# Patient Record
Sex: Female | Born: 1960 | Race: White | Hispanic: No | Marital: Married | Smoking: Never smoker
Health system: Southern US, Community
[De-identification: ages and names within clinical notes are randomized; demographics above are authoritative.]

## PROBLEM LIST (undated history)

## (undated) DIAGNOSIS — C4491 Basal cell carcinoma of skin, unspecified: Secondary | ICD-10-CM

## (undated) DIAGNOSIS — N810 Urethrocele: Secondary | ICD-10-CM

## (undated) DIAGNOSIS — Z8619 Personal history of other infectious and parasitic diseases: Secondary | ICD-10-CM

## (undated) HISTORY — DX: Basal cell carcinoma of skin, unspecified: C44.91

## (undated) HISTORY — DX: Personal history of other infectious and parasitic diseases: Z86.19

## (undated) HISTORY — PX: TUBAL LIGATION: SHX77

## (undated) HISTORY — PX: KNEE SURGERY: SHX244

## (undated) HISTORY — PX: MOHS SURGERY: SHX181

## (undated) HISTORY — DX: Urethrocele: N81.0

---
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
## 1998-05-10 ENCOUNTER — Other Ambulatory Visit: Admission: RE | Admit: 1998-05-10 | Discharge: 1998-05-10 | Payer: Self-pay | Admitting: Obstetrics and Gynecology

## 1999-06-19 ENCOUNTER — Other Ambulatory Visit: Admission: RE | Admit: 1999-06-19 | Discharge: 1999-06-19 | Payer: Self-pay | Admitting: Obstetrics and Gynecology

## 2000-07-02 ENCOUNTER — Encounter: Payer: Self-pay | Admitting: Obstetrics and Gynecology

## 2000-07-02 ENCOUNTER — Ambulatory Visit (HOSPITAL_COMMUNITY): Admission: RE | Admit: 2000-07-02 | Discharge: 2000-07-02 | Payer: Self-pay | Admitting: Obstetrics and Gynecology

## 2000-07-13 ENCOUNTER — Other Ambulatory Visit: Admission: RE | Admit: 2000-07-13 | Discharge: 2000-07-13 | Payer: Self-pay | Admitting: Obstetrics and Gynecology

## 2001-06-16 ENCOUNTER — Other Ambulatory Visit: Admission: RE | Admit: 2001-06-16 | Discharge: 2001-06-16 | Payer: Self-pay | Admitting: Obstetrics and Gynecology

## 2002-04-12 ENCOUNTER — Emergency Department (HOSPITAL_COMMUNITY): Admission: EM | Admit: 2002-04-12 | Discharge: 2002-04-12 | Payer: Self-pay | Admitting: Emergency Medicine

## 2002-04-13 ENCOUNTER — Ambulatory Visit (HOSPITAL_COMMUNITY): Admission: RE | Admit: 2002-04-13 | Discharge: 2002-04-13 | Payer: Self-pay | Admitting: Vascular Surgery

## 2002-06-02 ENCOUNTER — Ambulatory Visit (HOSPITAL_COMMUNITY): Admission: RE | Admit: 2002-06-02 | Discharge: 2002-06-02 | Payer: Self-pay | Admitting: Obstetrics and Gynecology

## 2002-07-05 ENCOUNTER — Other Ambulatory Visit: Admission: RE | Admit: 2002-07-05 | Discharge: 2002-07-05 | Payer: Self-pay | Admitting: Obstetrics and Gynecology

## 2002-07-11 ENCOUNTER — Encounter: Payer: Self-pay | Admitting: Obstetrics and Gynecology

## 2002-07-11 ENCOUNTER — Ambulatory Visit (HOSPITAL_COMMUNITY): Admission: RE | Admit: 2002-07-11 | Discharge: 2002-07-11 | Payer: Self-pay | Admitting: Obstetrics and Gynecology

## 2003-02-05 ENCOUNTER — Encounter: Payer: Self-pay | Admitting: Emergency Medicine

## 2003-02-05 ENCOUNTER — Emergency Department (HOSPITAL_COMMUNITY): Admission: EM | Admit: 2003-02-05 | Discharge: 2003-02-05 | Payer: Self-pay | Admitting: Unknown Physician Specialty

## 2004-05-06 ENCOUNTER — Other Ambulatory Visit: Admission: RE | Admit: 2004-05-06 | Discharge: 2004-05-06 | Payer: Self-pay | Admitting: Obstetrics and Gynecology

## 2004-10-28 ENCOUNTER — Ambulatory Visit (HOSPITAL_COMMUNITY): Admission: RE | Admit: 2004-10-28 | Discharge: 2004-10-28 | Payer: Self-pay | Admitting: Obstetrics and Gynecology

## 2005-05-15 ENCOUNTER — Other Ambulatory Visit: Admission: RE | Admit: 2005-05-15 | Discharge: 2005-05-15 | Payer: Self-pay | Admitting: Obstetrics and Gynecology

## 2005-07-07 ENCOUNTER — Emergency Department (HOSPITAL_COMMUNITY): Admission: EM | Admit: 2005-07-07 | Discharge: 2005-07-07 | Payer: Self-pay | Admitting: Emergency Medicine

## 2005-07-08 ENCOUNTER — Ambulatory Visit: Payer: Self-pay

## 2005-07-08 ENCOUNTER — Ambulatory Visit: Payer: Self-pay | Admitting: Cardiology

## 2005-07-11 ENCOUNTER — Ambulatory Visit: Payer: Self-pay | Admitting: Cardiology

## 2005-11-21 ENCOUNTER — Ambulatory Visit (HOSPITAL_COMMUNITY): Admission: RE | Admit: 2005-11-21 | Discharge: 2005-11-21 | Payer: Self-pay | Admitting: Obstetrics and Gynecology

## 2006-05-28 ENCOUNTER — Other Ambulatory Visit: Admission: RE | Admit: 2006-05-28 | Discharge: 2006-05-28 | Payer: Self-pay | Admitting: Obstetrics and Gynecology

## 2007-01-12 ENCOUNTER — Ambulatory Visit (HOSPITAL_COMMUNITY): Admission: RE | Admit: 2007-01-12 | Discharge: 2007-01-12 | Payer: Self-pay | Admitting: Obstetrics and Gynecology

## 2007-07-14 ENCOUNTER — Other Ambulatory Visit: Admission: RE | Admit: 2007-07-14 | Discharge: 2007-07-14 | Payer: Self-pay | Admitting: Obstetrics and Gynecology

## 2008-01-18 ENCOUNTER — Ambulatory Visit (HOSPITAL_COMMUNITY): Admission: RE | Admit: 2008-01-18 | Discharge: 2008-01-18 | Payer: Self-pay | Admitting: Obstetrics and Gynecology

## 2008-07-17 ENCOUNTER — Other Ambulatory Visit: Admission: RE | Admit: 2008-07-17 | Discharge: 2008-07-17 | Payer: Self-pay | Admitting: Obstetrics and Gynecology

## 2009-01-30 ENCOUNTER — Ambulatory Visit (HOSPITAL_COMMUNITY): Admission: RE | Admit: 2009-01-30 | Discharge: 2009-01-30 | Payer: Self-pay | Admitting: Obstetrics and Gynecology

## 2009-07-18 ENCOUNTER — Other Ambulatory Visit: Admission: RE | Admit: 2009-07-18 | Discharge: 2009-07-18 | Payer: Self-pay | Admitting: Obstetrics and Gynecology

## 2010-02-07 ENCOUNTER — Ambulatory Visit (HOSPITAL_COMMUNITY): Admission: RE | Admit: 2010-02-07 | Discharge: 2010-02-07 | Payer: Self-pay | Admitting: Obstetrics and Gynecology

## 2011-01-07 ENCOUNTER — Other Ambulatory Visit (HOSPITAL_COMMUNITY): Payer: Self-pay | Admitting: Obstetrics and Gynecology

## 2011-01-07 DIAGNOSIS — Z1231 Encounter for screening mammogram for malignant neoplasm of breast: Secondary | ICD-10-CM

## 2011-02-07 NOTE — Op Note (Signed)
NAME:  Annette Marks, Annette Marks                          ACCOUNT NO.:  [id number]   MEDICAL RECORD NO.:  [id number]                   PATIENT TYPE:  AMB   LOCATION:  SDC                                  FACILITY:  [facility]   PHYSICIAN:  Diana B. Thomasena Edis, M.D.              DATE OF BIRTH:  [birth year]   DATE OF PROCEDURE:  06/02/2002  DATE OF DISCHARGE:                                 OPERATIVE REPORT   PREOPERATIVE DIAGNOSES:  Multiparity, undesired fertility.   POSTOPERATIVE DIAGNOSES:  Multiparity, undesired fertility.   PROCEDURE:  Open laparoscopic tubal ligation using Falope rings.   SURGEON:  Beather Arbour. Thomasena Edis, M.D.   ESTIMATED BLOOD LOSS:  Minimal.   DRAINS:  None.   ANESTHESIA:  General endotracheal.   FLUIDS:  Approximately 1500 cc of crystalloid.   COMPLICATIONS:  None.   FINDINGS:  Normal pelvis.  Slightly retroverted uterus.  Normal appendix,  uterus, tubes, and ovaries.  Left tube was slightly tortuous and edematous.  Right tube was normal.   DESCRIPTION OF OPERATION:  The patient is brought to the operating room,  identified on the operating room table.  After induction of adequate general  endotracheal anesthesia the patient was placed in [place] stirrups and prepped  and draped in the usual fashion.  Examination under anesthesia revealed the  uterus to be slightly retroverted approximately 6 weeks and mobile.  The  speculum was placed and the posterior lip of the cervix was grasped with a  single tooth tenaculum and the Jarcho cannula was placed for uterine  mobility.  Attention was turned to the abdomen.  A small infraumbilical  incision was made and carried down to the fascia.  The fascia was scored in  the midline and extended bilaterally using Mayo scissors.  The peritoneum  was then identified and entered bluntly taking care to avoid bowel or other  abdominal contents.  This was done with the patient in Trendelenburg.  The  Hasson cannula was then placed and two  sutures of 0 Vicryl had been placed  at either angle of the fascial incision and were used to secure the Hasson  cannula in place.  The CO2 gas was attached and approximately 4 L of CO2 gas  was infused without difficulty.  The laparoscope was then placed in mid  avascular portion.  The abdominal wall was identified.  This was noted to be  well above the bladder.  A small incision was made in the midline and the  tissue was dissected using a hemostat down to the fascia.  The 8 mm trocar  was then placed under direct laparoscopic guidance.  This was done very  carefully and gently to decrease the risk of damage to adjacent structures.  The trocar was removed and the Falope ring applicator was placed.  The left  tube was identified and traced from its proximal to its distal end with  fimbria  being identified.  A paratubal cyst was noted.  It was picked up in  a mid avascular portion taking care to drop the mesosalpinx to increase the  risk of tubal failure.  A Falope ring was placed without difficulty.  It was  noted to blanch white immediately and, in fact, an excellent portion of tube  was noted to be contained within the ring.  As mentioned above, it was  placed around the tube taking care to drops the mesosalpinx.  In a similar  fashion the right tube was identified, traced from its proximal to its  distal end, the fimbria being identified.  A mid avascular portion was  identified, grasped with the Falope ring applicator, and the ring was placed  without difficulty.  It was noted to be well placed with mesosalpinx  contained within the tube.  The appendix was identified and then  photographed.  At that point examination revealed there to be a normal  pelvis.  The ovaries appeared to be normal.  There is no endometriosis which  could be visualized.  The Falope ring applicator was removed and the 8 mm  sleeve was removed.  There was noted to be no bleeding from the __________  punch trocar  site.  Subsequently, the Hasson cannula and laparoscope were  removed.  The fascia was closed using one of the sutures of 0 Vicryl.  This  was done in a simple running fashion.  Excellent fascial closure was  assured.  There was noted to be no bleeding on the subcutaneous tissue.  Marcaine 0.25% approximately 8 cc was placed in the cut edges of the  incisions for patient comfort.  The skin was closed using subcuticular  stitches of 4-0 undyed Vicryl.  Attention was then turned to the Jarcho  cannula.  The Jarcho cannula and tenaculum were removed.  There was noted to  be a slight amount of bleeding at tenaculum site and excellent hemostasis  was achieved using silver nitrate sticks.  The bladder was again straight  catheterized.  Initially had been straight catheterized for approximately  200 cc of urine.  Straight catheterized postoperative about 100 cc of urine.  The patient tolerated the procedure well without apparent complications and  was transferred to the recovery room in stable condition after all  instrument, sponge, needle counts were correct.  She was given laparoscopic  discharge instructions and urged to return to the office in two to three  weeks for the postoperative visit.  She was given a prescription for  Darvocet-N 100 dispensed 30 to take one to two p.o. q.4-6h. p.r.n. pain and  call with any problems.                                               Beather Arbour Thomasena Edis, M.D.    DBC/MEDQ  D:  06/02/2002  T:  06/02/2002  Job:  [id number]

## 2011-02-07 NOTE — H&P (Signed)
NAME:  Annette Marks, Annette Marks                          ACCOUNT NO.:  [id number]   MEDICAL RECORD NO.:  [id number]                   PATIENT TYPE:  AMB   LOCATION:  SDC                                  [facility]   PHYSICIAN:  Diana B. Thomasena Marks, M.D.              DATE OF BIRTH:  1961/05/16   DATE OF ADMISSION:  06/02/2002  DATE OF DISCHARGE:                                HISTORY & PHYSICAL   CURRENT HISTORY:  The patient is a 49 year old gravida 2, para 2, Caucasian  female who desires permanent sterilization.  She had originally decided on  this earlier and then subsequently changed her mind and started oral  contraceptives.  This was because she was having irregular cycles.  However,  she subsequently was experiencing some shortness of breath and dizziness and  was concerned that she might have a DVT, subsequently was seen in the  emergency room and had a Doppler study which was negative.  However, she has  decided to discontinue her oral contraceptives and still desires a tubal  ligation.  Risks of surgery including anesthetic complications, hemorrhage,  infection, damage to adjacent structures including bladder, bowel, blood  vessels, ureter were discussed with the patient.  She was made of the  failure of 10-15:1000 as well as if her periods become irregular she may  need to be back on oral contraceptives.  She furthermore has decided that  even if she were to remarry desires no further children.  She is further  made aware that if she were to have any failure from the tubal ligation this  is likely to be an ectopic pregnancy due to tubal damage, the etiology of  ectopic pregnancy as it relates to fistula formation was discussed with the  patient in detail.  She expresses understanding of and acceptance of these  risks and desires to proceed with this tubal ligation.  She does understand  that she must keep track of her periods and if they become irregular she  needs to do a  pregnancy test to rule out pregnancy.   OBSTETRICAL/GYNECOLOGICAL HISTORY:  Menarche age 89, cycles are every 25  days with a 4-day duration of flow.   PAST MEDICAL HISTORY:  History of pruritus ani which subsequently was  treated and resolved.   CURRENT MEDICATIONS:  None.   SURGERIES:  ACL repairs 1979 and 1997.   FAMILY HISTORY:  There is no family history of colon, breast, ovarian, or  prostate cancer.  The patient's mother is 55, alive and well; father is 21,  alive and well; she has two sisters ages 78 and 65 alive and well; one  daughter age 58 now, alive and well, two children ages 80 and 80, alive and  well.   SOCIAL HISTORY:  Occupation: The patient is an Charity fundraiser at [employer].  Tobacco: None.  Alcohol: None.   REVIEW OF SYSTEMS:  Noncontributory  except as noted above.  Denies headache,  visual changes, chest pain, shortness of breath, abdominal pain, change in  bowel habits, unintentional weight loss, dysuria, urgency, frequency,  vaginal coitus or discharge, pain or bleeding with intercourse.   PHYSICAL EXAMINATION:  GENERAL: Well-developed Caucasian female.  VITAL SIGNS: Blood pressure 118/70 heart rate 72, weight 162.  HEENT: Normal.  NECK: Supple without thyromegaly or adenopathy.  LUNGS: Clear to auscultation.  CARDIAC: Regular rate and rhythm.  BREASTS: Symmetrical without masses; no dimpling, retraction, or nipple  discharge.  ABDOMEN: Soft, nontender; no hepatosplenomegaly or masses.  PELVIC: Normal external female genitalia.  No vulva, vaginal, or cervical  lesions.  Pap smear was performed on June 16, 2001 and was found to be  within normal limits.  Bimanual examination reveals the uterus to be 6  weeks, slightly retroverted, nontender without any adnexal mass palpated.  RECTAL: Excellent sphincter tone confirms pelvic exam; no masses palpated.   ASSESSMENT AND PLAN:  The patient is a 50 year old gravida 2, para 2,  Caucasian female who  desires permanent sterilization as she desires no  further children.  She is made aware of the permanency of the procedure,  failure rate of 10-15 in 1000, increased risk of ectopic pregnancy should  she become pregnant as well as the usual surgical risks as was stated above.  She is also made aware of unforeseen risks.  She adamantly says that she  desires no further children and does desire permanent sterilization.                                               Annette Marks, M.D.    DBC/MEDQ  D:  06/02/2002  T:  06/02/2002  Job:  [id number]   cc:   Preop Area at [facility]

## 2011-02-11 ENCOUNTER — Ambulatory Visit (HOSPITAL_COMMUNITY)
Admission: RE | Admit: 2011-02-11 | Discharge: 2011-02-11 | Disposition: A | Discharge status: Home / Self Care | Payer: BC Managed Care – PPO | Source: Ambulatory Visit | Attending: Obstetrics and Gynecology | Admitting: Obstetrics and Gynecology

## 2011-02-11 DIAGNOSIS — Z1231 Encounter for screening mammogram for malignant neoplasm of breast: Secondary | ICD-10-CM | POA: Insufficient documentation

## 2011-12-22 ENCOUNTER — Encounter: Payer: Self-pay | Admitting: Family Medicine

## 2011-12-22 ENCOUNTER — Other Ambulatory Visit: Payer: Self-pay | Admitting: Family Medicine

## 2011-12-22 ENCOUNTER — Ambulatory Visit (INDEPENDENT_AMBULATORY_CARE_PROVIDER_SITE_OTHER): Payer: BC Managed Care – PPO | Admitting: Family Medicine

## 2011-12-22 VITALS — BP 120/84 | HR 60 | Ht 67.0 in | Wt 159.0 lb

## 2011-12-22 DIAGNOSIS — E7800 Pure hypercholesterolemia, unspecified: Secondary | ICD-10-CM

## 2011-12-22 DIAGNOSIS — Z23 Encounter for immunization: Secondary | ICD-10-CM

## 2011-12-22 DIAGNOSIS — E559 Vitamin D deficiency, unspecified: Secondary | ICD-10-CM

## 2011-12-22 DIAGNOSIS — Z0000 Encounter for general adult medical examination without abnormal findings: Secondary | ICD-10-CM

## 2011-12-22 DIAGNOSIS — Z111 Encounter for screening for respiratory tuberculosis: Secondary | ICD-10-CM

## 2011-12-22 DIAGNOSIS — Z0184 Encounter for antibody response examination: Secondary | ICD-10-CM

## 2011-12-22 LAB — POCT URINALYSIS DIPSTICK
Blood, UA: NEGATIVE
Glucose, UA: NEGATIVE
Nitrite, UA: NEGATIVE
Urobilinogen, UA: NEGATIVE
pH, UA: 7

## 2011-12-22 NOTE — Patient Instructions (Addendum)
HEALTH MAINTENANCE RECOMMENDATIONS:  It is recommended that you get at least 30 minutes of aerobic exercise at least 5 days/week (for weight loss, you may need as much as 60-90 minutes). This can be any activity that gets your heart rate up. This can be divided in 10-15 minute intervals if needed, but try and build up your endurance at least once a week.  Weight bearing exercise is also recommended twice weekly.  Eat a healthy diet with lots of vegetables, fruits and fiber.  "Colorful" foods have a lot of vitamins (ie green vegetables, tomatoes, red peppers, etc).  Limit sweet tea, regular sodas and alcoholic beverages, all of which has a lot of calories and sugar.  Up to 1 alcoholic drink daily may be beneficial for women (unless trying to lose weight, watch sugars).  Drink a lot of water.  Calcium recommendations are 1200-1500 mg daily (1500 mg for postmenopausal women or women without ovaries), and vitamin D 1000 IU daily.  This should be obtained from diet and/or supplements (vitamins), and calcium should not be taken all at once, but in divided doses.  Monthly self breast exams and yearly mammograms for women over the age of 12 is recommended.  Sunscreen of at least SPF 30 should be used on all sun-exposed parts of the skin when outside between the hours of 10 am and 4 pm (not just when at beach or pool, but even with exercise, golf, tennis, and yard work!)  Use a sunscreen that says "broad spectrum" so it covers both UVA and UVB rays, and make sure to reapply every 1-2 hours.  Remember to change the batteries in your smoke detectors when changing your clock times in the spring and fall.  Use your seat belt every time you are in a car, and please drive safely and not be distracted with cell phones and texting while driving.   Vitamin D 1000-2000 IU daily to help maintain your levels.

## 2011-12-22 NOTE — Progress Notes (Signed)
Annette Marks is a 51 y.o. female who presents for a complete physical.  She has the following concerns:  She will be going back to school to get her Master of Nursing Informatics at [employer] in August.  She is selling her house, moving into an apartment (here in [place]) and will continue to work at the [employer].  Health Maintenance: Immunization History  Administered Date(s) Administered  . Hepatitis B 02/20/1985, 04/22/1985, 04/20/1992  . Influenza Whole 06/12/2011  . Td 01/01/2004   Last Pap smear: last year with Dr. Estanislado Pandy, 01/2011 Last mammogram: 01/2011 Last colonoscopy: 04/2011, Dr. Ewing Schlein, polyps (?hyperplastic)--told to f/u in 10 years Last DEXA: normal through school--heel screen (5 years ago) Dentist: twice yearly Ophtho: 1.5 years ago, wears glasses Exercise: hour weight lifting 3x/week, walks 3-6 miles 4x/week, and kickboxing Normal CBC, c-met and lipids 07/2010 (chol 222, HDL 88, LDL 121, TG 65)  Past Medical History  Diagnosis Date  . Urethrocele, female     h/o normal cystoscopy (Dr. Aldean Ast)  . Vitamin d deficiency 2010    23 by Dr. Thomasena Edis, normal after taking supplements    Past Surgical History  Procedure Date  . Knee surgery 1979, 1997    left--torn ligaments  . Tubal ligation     History   Social History  . Marital Status: Divorced    Spouse Name: N/A    Number of Children: 2  . Years of Education: N/A   Occupational History  . RN [employer]   Social History Main Topics  . Smoking status: Never Smoker   . Smokeless tobacco: Never Used  . Alcohol Use: Yes     2-3 drinks per month.  . Drug Use: No  . Sexually Active: Not on file   Other Topics Concern  . Not on file   Social History Narrative   Lives alone. Daughter is IT trainer in [place], son in [place]. No pets    Family History  Problem Relation Age of Onset  . Hyperlipidemia Father    No current outpatient prescriptions on file.  No Known  Allergies  ROS: The patient denies anorexia, fever, weight changes, headaches,  vision changes, decreased hearing, ear pain, sore throat, breast concerns, chest pain, palpitations, dizziness, syncope, dyspnea on exertion, cough, swelling, nausea, vomiting, diarrhea, constipation, abdominal pain, melena, hematochezia, indigestion/heartburn, hematuria, incontinence, dysuria, vaginal bleeding (slowing down--see below), discharge, odor or itch, genital lesions, joint pains, numbness, tingling, weakness, tremor, suspicious skin lesions, depression, anxiety, abnormal bleeding/bruising, or enlarged lymph nodes.  LMP 08/2011, before that was 12/2010.  +hot flashes, night sweats--sometimes had a strange sensation just prior to sweating.  Sees Dr. Karlyn Agee yearly.  PHYSICAL EXAM: BP 120/84  Pulse 60  Ht 5\' 7"  (1.702 m)  Wt 159 lb (72.122 kg)  BMI 24.90 kg/m2  LMP 01/17/2011  General Appearance:    Alert, cooperative, no distress, appears stated age  Head:    Normocephalic, without obvious abnormality, atraumatic  Eyes:    PERRL, conjunctiva/corneas clear, EOM's intact, fundi    benign  Ears:    Normal TM's and external ear canals  Nose:   Nares normal, mucosa normal, no drainage or sinus   tenderness  Throat:   Lips, mucosa, and tongue normal; teeth and gums normal  Neck:   Supple, no lymphadenopathy;  thyroid:  no   enlargement/tenderness/nodules; no carotid   bruit or JVD  Back:    Spine nontender, no curvature, ROM normal, no CVA  tenderness  Lungs:     Clear to auscultation bilaterally without wheezes, rales or     ronchi; respirations unlabored  Chest Wall:    No tenderness or deformity   Heart:    Regular rate and rhythm, S1 and S2 normal, no murmur, rub   or gallop  Breast Exam:    Deferred to GYN  Abdomen:     Soft, non-tender, nondistended, normoactive bowel sounds,    no masses, no hepatosplenomegaly  Genitalia:    Deferred to GYN     Extremities:   No clubbing, cyanosis or edema    Pulses:   2+ and symmetric all extremities  Skin:   Skin color, texture, turgor normal, no rashes or lesions  Lymph nodes:   Cervical, supraclavicular, and axillary nodes normal  Neurologic:   CNII-XII intact, normal strength, sensation and gait; reflexes 2+ and symmetric throughout          Psych:   Normal mood, affect, hygiene and grooming.    ASSESSMENT/PLAN: 1. Routine general medical examination at a health care facility  Visual acuity screening, POCT Urinalysis Dipstick, Glucose, random  2. Immunity status testing  Varicella zoster antibody, IgG, Measles/Mumps/Rubella Immunity  3. Need for Tdap vaccination  Tdap vaccine greater than or equal to 7yo IM  4. Pure hypercholesterolemia  Lipid panel  5. Unspecified vitamin D deficiency    6. Screening examination for pulmonary tuberculosis  TB Skin Test   Excellent lipid profile (with good HDL, and LDL<130), desires repeat today.  H/o Vitamin D deficiency--restart supplementation of 1000-2000 IU daily.  Discussed monthly self breast exams and yearly mammograms after the age of 63; at least 30 minutes of aerobic activity at least 5 days/week; proper sunscreen use reviewed; healthy diet, including goals of calcium and vitamin D intake and alcohol recommendations (less than or equal to 1 drink/day) reviewed; regular seatbelt use; changing batteries in smoke detectors.  Immunization recommendations discussed--TdaP today.  Colonoscopy recommendations reviewed--UTD

## 2011-12-23 LAB — LIPID PANEL
Cholesterol: 218 mg/dL — ABNORMAL HIGH (ref 0–200)
LDL Cholesterol: 116 mg/dL — ABNORMAL HIGH (ref 0–99)
VLDL: 14 mg/dL (ref 0–40)

## 2011-12-24 LAB — TB SKIN TEST: TB Skin Test: NEGATIVE mm

## 2012-01-07 ENCOUNTER — Other Ambulatory Visit (INDEPENDENT_AMBULATORY_CARE_PROVIDER_SITE_OTHER): Payer: BC Managed Care – PPO

## 2012-01-07 DIAGNOSIS — Z111 Encounter for screening for respiratory tuberculosis: Secondary | ICD-10-CM

## 2012-01-12 LAB — TB SKIN TEST
Induration: 0
TB Skin Test: NEGATIVE mm

## 2012-01-14 ENCOUNTER — Other Ambulatory Visit: Payer: Self-pay | Admitting: Obstetrics and Gynecology

## 2012-01-14 DIAGNOSIS — Z1231 Encounter for screening mammogram for malignant neoplasm of breast: Secondary | ICD-10-CM

## 2012-01-21 ENCOUNTER — Ambulatory Visit: Payer: Self-pay | Admitting: Obstetrics and Gynecology

## 2012-03-08 ENCOUNTER — Ambulatory Visit (HOSPITAL_COMMUNITY)
Admission: RE | Admit: 2012-03-08 | Discharge: 2012-03-08 | Disposition: A | Discharge status: Home / Self Care | Payer: BC Managed Care – PPO | Source: Ambulatory Visit | Attending: Obstetrics and Gynecology | Admitting: Obstetrics and Gynecology

## 2012-03-08 DIAGNOSIS — Z1231 Encounter for screening mammogram for malignant neoplasm of breast: Secondary | ICD-10-CM

## 2012-03-29 ENCOUNTER — Ambulatory Visit (INDEPENDENT_AMBULATORY_CARE_PROVIDER_SITE_OTHER): Payer: BC Managed Care – PPO | Admitting: Obstetrics and Gynecology

## 2012-03-29 ENCOUNTER — Encounter: Payer: Self-pay | Admitting: Obstetrics and Gynecology

## 2012-03-29 VITALS — BP 100/76 | Ht 68.75 in | Wt 166.0 lb

## 2012-03-29 DIAGNOSIS — Z124 Encounter for screening for malignant neoplasm of cervix: Secondary | ICD-10-CM

## 2012-03-29 DIAGNOSIS — Z780 Asymptomatic menopausal state: Secondary | ICD-10-CM

## 2012-03-29 DIAGNOSIS — N810 Urethrocele: Secondary | ICD-10-CM | POA: Insufficient documentation

## 2012-03-29 DIAGNOSIS — N368 Other specified disorders of urethra: Secondary | ICD-10-CM

## 2012-03-29 DIAGNOSIS — Z0000 Encounter for general adult medical examination without abnormal findings: Secondary | ICD-10-CM

## 2012-03-29 NOTE — Patient Instructions (Signed)
ACOG Menopause, HRT, Herbal supplements

## 2012-03-29 NOTE — Progress Notes (Signed)
The patient is not taking hormone replacement therapy Menopause 08/2011 The patient  is not taking a Calcium supplement. Post-menopausal bleeding:no  Last Pap: was normal April  2012 Last mammogram: was normal June 2013 Last DEXA scan : 10 yrs ago  normal Last colonoscopy:normal June 2012  Urinary symptoms: none Normal bowel movements: Yes Reports abuse at home: No  Subjective:    Annette Marks is a 51 y.o. female G2P2 who presents for annual exam.  The patient has no complaints today.   The following portions of the patient's history were reviewed and updated as appropriate: allergies, current medications, past family history, past medical history, past social history, past surgical history and problem list.  Review of Systems Pertinent items are noted in HPI. Gastrointestinal:No change in bowel habits, no abdominal pain, no rectal bleeding Genitourinary:negative for dysuria, frequency, hematuria, nocturia and urinary incontinence    Objective:     BP 100/76  Ht 5' 8.75" (1.746 m)  Wt 166 lb (75.297 kg)  BMI 24.69 kg/m2  LMP 09/06/2011  Weight:  Wt Readings from Last 1 Encounters:  03/29/12 166 lb (75.297 kg)     BMI: Body mass index is 24.69 kg/(m^2). General Appearance: Alert, appropriate appearance for age. No acute distress HEENT: Grossly normal Neck / Thyroid: Supple, no masses, nodes or enlargement Lungs: clear to auscultation bilaterally Back: No CVA tenderness Breast Exam: No masses or nodes.No dimpling, nipple retraction or discharge. Cardiovascular: Regular rate and rhythm. S1, S2, no murmur Gastrointestinal: Soft, non-tender, no masses or organomegaly Pelvic Exam: Vulva and vagina appear normal. Bimanual exam reveals normal uterus and adnexa. Rectovaginal: normal rectal, no masses Lymphatic Exam: Non-palpable nodes in neck, clavicular, axillary, or inguinal regions Skin: no rash or abnormalities Neurologic: Normal gait and speech, no tremor  Psychiatric:  Alert and oriented, appropriate affect.         Assessment:    Normal gyn exam Menopause    Plan:    Pap, Mammogram  Reviewed HRT, R&B, [facility]  Pt declines for now Follow-up:  for annual exam

## 2012-03-30 LAB — PAP IG W/ RFLX HPV ASCU

## 2012-07-15 ENCOUNTER — Encounter: Payer: Self-pay | Admitting: Family Medicine

## 2012-07-15 ENCOUNTER — Ambulatory Visit (INDEPENDENT_AMBULATORY_CARE_PROVIDER_SITE_OTHER): Payer: BC Managed Care – PPO | Admitting: Family Medicine

## 2012-07-15 VITALS — BP 102/68 | HR 60 | Temp 98.0°F | Ht 68.25 in | Wt 168.0 lb

## 2012-07-15 DIAGNOSIS — N390 Urinary tract infection, site not specified: Secondary | ICD-10-CM

## 2012-07-15 DIAGNOSIS — R350 Frequency of micturition: Secondary | ICD-10-CM

## 2012-07-15 LAB — POCT URINALYSIS DIPSTICK
Protein, UA: NEGATIVE
Urobilinogen, UA: NEGATIVE

## 2012-07-15 MED ORDER — CIPROFLOXACIN HCL 250 MG PO TABS: 250.0000 mg | ORAL_TABLET | Freq: Two times a day (BID) | ORAL | Status: DC

## 2012-07-15 NOTE — Patient Instructions (Addendum)
Call if your symptoms persist or worsen, especially if you develop high fevers, vomiting, flank pain.

## 2012-07-15 NOTE — Progress Notes (Signed)
Chief Complaint  Patient presents with  . Urinary Frequency    and pain started on Sunday. Felt better Monday. Tues at work did not drink many fluids but is still feeling a spastic feeling-just doesn't feel very good.   HPI: 4 days ago began with some urgency and dysuria.  She pushed fluids, and felt better the next day.  2 days ago, while in class in [place], she had recurrent pressure and some bladder spasms, as well as aching all over.  She was very busy yesterday, didn't drink much fluids, and burning and urgency got worse yesterday.  T 100.8 yesterday, and noticed blood in her urine and with wiping.  Denies any vaginal bleeding. Denies vaginal discharge.    Had some chills last night.  Hasn't taken any tylenol since last night.  H/o kidney infection during pregnancy 23 years ago. Denies flank pain.  Has some lower abdominal pressure/pain.  Denies nausea/vomiting.  Past Medical History  Diagnosis Date  . Urethrocele, female     h/o normal cystoscopy (Dr. Aldean Ast)  . Vitamin D deficiency 2010    23 by Dr. Thomasena Edis, normal after taking supplements  . H/O varicella   . H/O mumps    Past Surgical History  Procedure Date  . Knee surgery 1979, 1997    left--torn ligaments/acl  . Tubal ligation   . Wisdom tooth extraction 1979   History   Social History  . Marital Status: Divorced    Spouse Name: N/A    Number of Children: 2  . Years of Education: N/A   Occupational History  . RN [employer]   Social History Main Topics  . Smoking status: Never Smoker   . Smokeless tobacco: Never Used  . Alcohol Use: Yes     2-3 drinks per month.  . Drug Use: No  . Sexually Active: Yes    Birth Control/ Protection: Surgical     BTL   Other Topics Concern  . Not on file   Social History Narrative   Lives alone. Daughter is IT trainer in [place], son in [place]. No pets. 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 No Known Allergies  ROS:  Denies dizziness, nausea, vomiting, diarrhea, skin rash. +fever and urinary symptoms as per HPI.  Denies back pain or other concerns.  PHYSICAL EXAM: BP 102/68  Pulse 60  Temp 98 F (36.7 C)  Ht 5' 8.25" (1.734 m)  Wt 168 lb (76.204 kg)  BMI 25.36 kg/m2  LMP 09/06/2011 Well developed, pleasant female in no distress Back: no CVA tenderness Abdomen: soft, mild suprapubic tenderness.  No rebound tenderness, guarding or masses Skin: no rash Neuro: alert and oriented Psych: normal mood, affect  ASSESSMENT/PLAN:  1. Urinary frequency  POCT Urinalysis Dipstick  2. Urinary tract infection, site not specified  ciprofloxacin (CIPRO) 250 MG tablet, Urine culture   Call Monday if symptoms not resolved, sooner if ongoing fevers, develops flank pain, vomiting, can't tolerate PO ABX, etc.

## 2012-07-17 LAB — URINE CULTURE

## 2012-12-06 ENCOUNTER — Other Ambulatory Visit: Payer: BC Managed Care – PPO

## 2013-02-09 ENCOUNTER — Other Ambulatory Visit: Payer: Self-pay | Admitting: Family Medicine

## 2013-02-09 DIAGNOSIS — Z1231 Encounter for screening mammogram for malignant neoplasm of breast: Secondary | ICD-10-CM

## 2013-04-11 ENCOUNTER — Ambulatory Visit (HOSPITAL_COMMUNITY)
Admission: RE | Admit: 2013-04-11 | Discharge: 2013-04-11 | Disposition: A | Discharge status: Home / Self Care | Payer: 59 | Source: Ambulatory Visit | Attending: Family Medicine | Admitting: Family Medicine

## 2013-04-11 DIAGNOSIS — Z1231 Encounter for screening mammogram for malignant neoplasm of breast: Secondary | ICD-10-CM | POA: Insufficient documentation

## 2013-06-14 ENCOUNTER — Telehealth: Payer: Self-pay | Admitting: Family Medicine

## 2013-06-14 NOTE — Telephone Encounter (Signed)
Pt called to make a cpe appt. Pt was informed you are out until Feb for CPE. Pt states that she must have CPE by end of year or her insurance rates go up $60 a month. Pt was placed on cancellation list. Please inform of how to handle.

## 2013-06-14 NOTE — Telephone Encounter (Signed)
Pt scheduled an appt for Friday.  °

## 2013-06-14 NOTE — Telephone Encounter (Signed)
Why not schedule her for this Friday?  I have an opening for a physical in the morning.  Not sure why my schedule is so blocked, should be a normal day, not a single provider

## 2013-06-17 ENCOUNTER — Encounter: Payer: Self-pay | Admitting: Family Medicine

## 2013-06-17 ENCOUNTER — Ambulatory Visit (INDEPENDENT_AMBULATORY_CARE_PROVIDER_SITE_OTHER): Payer: 59 | Admitting: Family Medicine

## 2013-06-17 VITALS — BP 104/68 | HR 60 | Ht 68.0 in | Wt 166.0 lb

## 2013-06-17 DIAGNOSIS — E559 Vitamin D deficiency, unspecified: Secondary | ICD-10-CM

## 2013-06-17 DIAGNOSIS — R5381 Other malaise: Secondary | ICD-10-CM

## 2013-06-17 DIAGNOSIS — Z8342 Family history of familial hypercholesterolemia: Secondary | ICD-10-CM

## 2013-06-17 DIAGNOSIS — Z8349 Family history of other endocrine, nutritional and metabolic diseases: Secondary | ICD-10-CM

## 2013-06-17 DIAGNOSIS — Z0000 Encounter for general adult medical examination without abnormal findings: Secondary | ICD-10-CM

## 2013-06-17 LAB — LIPID PANEL
Cholesterol: 209 mg/dL — ABNORMAL HIGH (ref 0–200)
Total CHOL/HDL Ratio: 2.9 Ratio
VLDL: 16 mg/dL (ref 0–40)

## 2013-06-17 LAB — COMPREHENSIVE METABOLIC PANEL
AST: 26 U/L (ref 0–37)
Albumin: 4.4 g/dL (ref 3.5–5.2)
Alkaline Phosphatase: 50 U/L (ref 39–117)
BUN: 17 mg/dL (ref 6–23)
Potassium: 4.4 mEq/L (ref 3.5–5.3)
Sodium: 138 mEq/L (ref 135–145)

## 2013-06-17 LAB — CBC WITH DIFFERENTIAL/PLATELET
Basophils Relative: 1 % (ref 0–1)
Eosinophils Absolute: 0.1 10*3/uL (ref 0.0–0.7)
Eosinophils Relative: 1 % (ref 0–5)
HCT: 41.4 % (ref 36.0–46.0)
Hemoglobin: 14.5 g/dL (ref 12.0–15.0)
MCH: 30.5 pg (ref 26.0–34.0)
MCHC: 35 g/dL (ref 30.0–36.0)
MCV: 87 fL (ref 78.0–100.0)
Monocytes Absolute: 0.5 10*3/uL (ref 0.1–1.0)
Monocytes Relative: 8 % (ref 3–12)
Neutrophils Relative %: 57 % (ref 43–77)

## 2013-06-17 LAB — POCT URINALYSIS DIPSTICK
Ketones, UA: NEGATIVE
Protein, UA: NEGATIVE
Spec Grav, UA: 1.01
pH, UA: 6

## 2013-06-17 NOTE — Progress Notes (Signed)
Chief Complaint  Patient presents with  . Annual Exam    fasting annual exam no pap sees Dr.Rivard and is UTD. Did want you to know that her mother has elevated platelet numbers, wonders if she should keep an eye on hers. Also needs PPD for March, should she just come in closer to that time?   Annette Marks is a 52 y.o. female who presents for a complete physical.  She has no specific concerns, other than wanting labs done today, specifically CBC given mother's history.  Immunization History  Administered Date(s) Administered  . Hepatitis B 02/20/1985, 04/22/1985, 04/20/1992  . Influenza Whole 06/12/2011, 06/08/2013  . PPD Test 12/22/2011, 01/07/2012  . Td 01/01/2004  . Tdap 12/22/2011   Last Pap smear: last year with Dr. Estanislado Pandy, 03/2012 Last mammogram: 03/2013 Last colonoscopy: 04/2011, Dr. Ewing Schlein, polyps (?hyperplastic)--told to f/u in 10 years  Last DEXA: normal through school--heel screen (6 years ago)  Dentist: twice yearly  Ophtho: wears glasses, goes every 2 years Exercise: hour weight lifting 3x/week, walks 3-6 miles 3x/week, and running 2 days/week   Past Medical History  Diagnosis Date  . Urethrocele, female     h/o normal cystoscopy (Dr. Aldean Ast)  . Vitamin D deficiency 2010    23 by Dr. Thomasena Edis, normal after taking supplements  . H/O varicella   . H/O mumps     Past Surgical History  Procedure Laterality Date  . Knee surgery  1979, 1997    left--torn ligaments/acl  . Tubal ligation    . Wisdom tooth extraction  1979    History   Social History  . Marital Status: Divorced    Spouse Name: N/A    Number of Children: 2  . Years of Education: N/A   Occupational History  . RN [employer]   Social History Main Topics  . Smoking status: Never Smoker   . Smokeless tobacco: Never Used  . Alcohol Use: Yes     Comment: 2-3 drinks per month.  . Drug Use: No  . Sexual Activity: Yes    Birth Control/ Protection: Surgical     Comment: BTL   Other  Topics Concern  . Not on file   Social History Narrative   Lives alone. Daughter is IT trainer in [place], son in [place]. No pets. 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 Hyperlipidemia Father   . Hypertension Father   . Immunodeficiency Mother     thrombocythemia  . Arthritis Mother     joint pains; on methotrexate, sees rheum.  NOT RA  . Birth defects Neg Hx   . Diabetes Neg Hx     Current outpatient prescriptions:Multiple Vitamin (MULTIVITAMIN) tablet, Take 1 tablet by mouth daily., Disp: , Rfl:   No Known Allergies  ROS: The patient denies anorexia, fever, weight changes (some gain noted since in school); denies headaches, vision changes, decreased hearing, ear pain, sore throat, breast concerns, chest pain, palpitations, dizziness, syncope, dyspnea on exertion, cough, swelling, nausea, vomiting, diarrhea, constipation, abdominal pain, melena, hematochezia, indigestion/heartburn, hematuria, incontinence, dysuria, vaginal bleeding, discharge, odor or itch, genital lesions, joint pains, numbness, tingling, weakness, tremor, suspicious skin lesions, depression, anxiety, abnormal bleeding/bruising, or enlarged lymph nodes. Sees derm (Dr. Karlyn Agee) yearly Hot flashes are tolerable for the most-part.  Short-lived sadness and palpitations just prior to a hot flash  PHYSICAL EXAM:  BP 104/68  Pulse 60  Ht 5\' 8"  (1.727 m)  Wt 166 lb (75.297 kg)  BMI 25.25 kg/m2  LMP 09/06/2011  General Appearance:  Alert, cooperative, no distress, appears stated age   Head:  Normocephalic, without obvious abnormality, atraumatic   Eyes:  PERRL, conjunctiva/corneas clear, EOM's intact, fundi  benign   Ears:  Normal TM's and external ear canals   Nose:  Nares normal, mucosa normal, no drainage or sinus tenderness   Throat:  Lips, mucosa, and tongue normal; teeth and gums normal   Neck:  Supple, no lymphadenopathy;  thyroid: no enlargement/tenderness/nodules; no carotid  bruit or JVD   Back:  Spine nontender, no curvature, ROM normal, no CVA tenderness   Lungs:  Clear to auscultation bilaterally without wheezes, rales or ronchi; respirations unlabored   Chest Wall:  No tenderness or deformity   Heart:  Regular rate and rhythm, S1 and S2 normal, no murmur, rub  or gallop   Breast Exam:  Deferred to GYN   Abdomen:  Soft, non-tender, nondistended, normoactive bowel sounds,  no masses, no hepatosplenomegaly   Genitalia:  Deferred to GYN      Extremities:  No clubbing, cyanosis or edema   Pulses:  2+ and symmetric all extremities   Skin:  Skin color, texture, turgor normal, no rashes or lesions   Lymph nodes:  Cervical, supraclavicular, and axillary nodes normal   Neurologic:  CNII-XII intact, normal strength, sensation and gait; reflexes 2+ and symmetric throughout          Psych: Normal mood, affect, hygiene and grooming.    ASSESSMENT/PLAN:  Routine general medical examination at a health care facility - Plan: POCT Urinalysis Dipstick, Visual acuity screening, Lipid panel, Comprehensive metabolic panel, CBC with Differential, TSH  Family history of high cholesterol - Plan: Lipid panel  Unspecified vitamin D deficiency - Plan: Vit D  25 hydroxy (rtn osteoporosis monitoring)  Other malaise and fatigue - Plan: Comprehensive metabolic panel, CBC with Differential, Vit D  25 hydroxy (rtn osteoporosis monitoring), TSH  Excellent lipid profile (with good HDL, and LDL<130), desires repeat today.   Discussed monthly self breast exams and yearly mammograms; at least 30 minutes of aerobic activity at least 5 days/week; proper sunscreen use reviewed; healthy diet, including goals of calcium and vitamin D intake and alcohol recommendations (less than or equal to 1 drink/day) reviewed; regular seatbelt use; changing batteries in smoke detectors. Immunization recommendations discussed--UTD.  Briefly discussed  zostavax, although likely not covered until age 43. Colonoscopy recommendations reviewed--UTD    Return in Feb or March for PPD ([place]); 1 year for CPE

## 2013-06-17 NOTE — Patient Instructions (Addendum)
HEALTH MAINTENANCE RECOMMENDATIONS:  It is recommended that you get at least 30 minutes of aerobic exercise at least 5 days/week (for weight loss, you may need as much as 60-90 minutes). This can be any activity that gets your heart rate up. This can be divided in 10-15 minute intervals if needed, but try and build up your endurance at least once a week.  Weight bearing exercise is also recommended twice weekly.  Eat a healthy diet with lots of vegetables, fruits and fiber.  "Colorful" foods have a lot of vitamins (ie green vegetables, tomatoes, red peppers, etc).  Limit sweet tea, regular sodas and alcoholic beverages, all of which has a lot of calories and sugar.  Up to 1 alcoholic drink daily may be beneficial for women (unless trying to lose weight, watch sugars).  Drink a lot of water.  Calcium recommendations are 1200-1500 mg daily (1500 mg for postmenopausal women or women without ovaries), and vitamin D 1000 IU daily.  This should be obtained from diet and/or supplements (vitamins), and calcium should not be taken all at once, but in divided doses.  Monthly self breast exams and yearly mammograms for women over the age of 46 is recommended.  Sunscreen of at least SPF 30 should be used on all sun-exposed parts of the skin when outside between the hours of 10 am and 4 pm (not just when at beach or pool, but even with exercise, golf, tennis, and yard work!)  Use a sunscreen that says "broad spectrum" so it covers both UVA and UVB rays, and make sure to reapply every 1-2 hours.  Remember to change the batteries in your smoke detectors when changing your clock times in the spring and fall.  Use your seat belt every time you are in a car, and please drive safely and not be distracted with cell phones and texting while driving.  Consider soy and/or black cohosh to help with menopausal symptoms (ie Estroven)

## 2013-06-19 ENCOUNTER — Encounter: Payer: Self-pay | Admitting: Family Medicine

## 2013-08-22 ENCOUNTER — Other Ambulatory Visit (INDEPENDENT_AMBULATORY_CARE_PROVIDER_SITE_OTHER): Payer: 59

## 2013-08-22 DIAGNOSIS — Z111 Encounter for screening for respiratory tuberculosis: Secondary | ICD-10-CM

## 2013-09-09 ENCOUNTER — Telehealth: Payer: Self-pay | Admitting: Family Medicine

## 2013-09-09 DIAGNOSIS — Z8249 Family history of ischemic heart disease and other diseases of the circulatory system: Secondary | ICD-10-CM

## 2013-09-09 NOTE — Telephone Encounter (Signed)
Pt called and stated that Dr. Darrick Penna wants her to have a ultrasound. She states that her father, Cletus Gash also your pt., was sent to see Dr. Darrick Penna and he wants all of Mr. Lacretia Leigh children to have ultrasounds to rule out aneurysms. Please refer pt for this test. Pt has a new phone number which is [phone].

## 2013-09-09 NOTE — Telephone Encounter (Signed)
Yes, I read note recommending screening for AAA in children >50 (also said to consider screen for popliteal). Please schedule AAA and advise pt. Order entered.

## 2013-09-12 ENCOUNTER — Other Ambulatory Visit: Payer: Self-pay | Admitting: *Deleted

## 2013-09-12 ENCOUNTER — Telehealth: Payer: Self-pay | Admitting: *Deleted

## 2013-09-12 DIAGNOSIS — Z8249 Family history of ischemic heart disease and other diseases of the circulatory system: Secondary | ICD-10-CM

## 2013-09-12 NOTE — Telephone Encounter (Signed)
Called patient and let her know that she is scheduled for Aortic US-09/16/13 @ 8:45am.

## 2013-09-16 ENCOUNTER — Other Ambulatory Visit: Payer: BC Managed Care – PPO

## 2013-09-19 ENCOUNTER — Telehealth: Payer: Self-pay | Admitting: Family Medicine

## 2013-09-19 ENCOUNTER — Ambulatory Visit
Admission: RE | Admit: 2013-09-19 | Discharge: 2013-09-19 | Disposition: A | Discharge status: Home / Self Care | Payer: 59 | Source: Ambulatory Visit | Attending: Family Medicine | Admitting: Family Medicine

## 2013-09-19 DIAGNOSIS — Z8249 Family history of ischemic heart disease and other diseases of the circulatory system: Secondary | ICD-10-CM

## 2013-09-19 NOTE — Telephone Encounter (Signed)
Left message on phone letting pt know that her ultrasound was negative and results are in [place]

## 2013-09-19 NOTE — Telephone Encounter (Signed)
Pt returned our call, advised of imaging results.

## 2013-09-19 NOTE — Telephone Encounter (Signed)
Please call patient advise her that her ultrasound was normal.  I released the results through MyChart for her to view

## 2013-09-27 NOTE — Telephone Encounter (Signed)
09/27/2013 °

## 2014-02-08 ENCOUNTER — Ambulatory Visit (INDEPENDENT_AMBULATORY_CARE_PROVIDER_SITE_OTHER): Payer: 59 | Admitting: Family Medicine

## 2014-02-08 ENCOUNTER — Encounter: Payer: Self-pay | Admitting: Family Medicine

## 2014-02-08 VITALS — BP 132/76 | HR 60 | Ht 68.0 in | Wt 173.0 lb

## 2014-02-08 DIAGNOSIS — L0390 Cellulitis, unspecified: Secondary | ICD-10-CM

## 2014-02-08 DIAGNOSIS — L0291 Cutaneous abscess, unspecified: Secondary | ICD-10-CM

## 2014-02-08 DIAGNOSIS — M67449 Ganglion, unspecified hand: Secondary | ICD-10-CM

## 2014-02-08 DIAGNOSIS — M79609 Pain in unspecified limb: Secondary | ICD-10-CM

## 2014-02-08 DIAGNOSIS — M6740 Ganglion, unspecified site: Secondary | ICD-10-CM

## 2014-02-08 DIAGNOSIS — M79645 Pain in left finger(s): Secondary | ICD-10-CM

## 2014-02-08 MED ORDER — NAPROXEN 500 MG PO TABS: 500.0000 mg | ORAL_TABLET | Freq: Two times a day (BID) | ORAL | Status: DC

## 2014-02-08 MED ORDER — CEPHALEXIN 500 MG PO CAPS: 500.0000 mg | ORAL_CAPSULE | Freq: Three times a day (TID) | ORAL | Status: DC

## 2014-02-08 NOTE — Progress Notes (Signed)
Chief Complaint  Patient presents with  . Hand Pain    left middle finger red and swollen x 3 weeks. Has tried soaking and polysporin. Unsure if this is a cellulitis or if she could possibly have a foreign body in it.     She first noticed pain 3 weeks at at her left 3rd finger, in the middle phalanx. No known trauma or injury. She started soaking it twice daily, and used polysporin at night.  She thought maybe there was some improvement initially, but over this week it has become worse again.  It is tight when she bends her finger.  It is more sensitive/tender this week  Going out of town next week (hiking in [place]), worried about it getting worse, infected.  Past Medical History  Diagnosis Date  . Urethrocele, female     h/o normal cystoscopy (Dr. Serita Butcher)  . Vitamin D deficiency 2010    23 by Dr. Theda Sers, normal after taking supplements  . H/O varicella   . H/O mumps    Past Surgical History  Procedure Laterality Date  . Knee surgery  1979, 1997    left--torn ligaments/acl  . Tubal ligation    . Wisdom tooth extraction  1979   History   Social History  . Marital Status: Divorced    Spouse Name: N/A    Number of Children: 2  . Years of Education: N/A   Occupational History  . RN [employer]   Social History Main Topics  . Smoking status: Never Smoker   . Smokeless tobacco: Never Used  . Alcohol Use: Yes     Comment: 2-3 drinks per month.  . Drug Use: No  . Sexual Activity: Yes    Birth Control/ Protection: Surgical     Comment: BTL   Other Topics Concern  . Not on file   Social History Narrative   Lives alone. Daughter is Engineer, maintenance (IT) in [place], son in [place]. No pets. 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 Multiple Vitamin (MULTIVITAMIN) tablet Take 1 tablet by mouth daily.       No current facility-administered  medications on file prior to visit.   No Known Allergies  ROS: no fevers, chills, headaches, rashes, nausea, vomiting, diarrhea, other joint pains or other concerns.  See HPI  PHYSICAL EXAM: BP 132/76  Pulse 60  Ht 5\' 8"  (1.727 m)  Wt 173 lb (78.472 kg)  BMI 26.31 kg/m2  LMP 09/06/2011 Well developed, pleasant female in no distress Left hand: 1 cm mildly erythematous lesion on dorsum of left 3rd middle phalynx, focal/circumscribed area.  There is no fluctuance, warmth or drainage.  Skin is intact No streaks.  Brisk cap refill.    ASSESSMENT/PLAN:  Pain in finger of left hand - Plan: naproxen (NAPROSYN) 500 MG tablet  Cellulitis - Plan: cephALEXin (KEFLEX) 500 MG capsule  Digital mucous cyst  Suspected digital cyst that is inflamed Treat with NSAIDs for 1-2 weeks.  Use Keflex only if increasing redness, swelling, pain, streaks, fevers, etc (given as an emergency given that she is going out of town).  If no improvement, will refer to Dr. Amedeo Plenty for evaluation.

## 2014-02-08 NOTE — Patient Instructions (Signed)
Start the naproxen twice daily with food.  Don't take any advil or other pain medications (tylenol is ok).  Take it regularly for at least a week, you can take it for the full course if you need to.  If symptoms are worsening despite treatment--ie increasing redness, pain, streaks, fevers, increasing swelling to involve more of the finger--than go ahead and start the antibiotic for treatment of a cellulitis.    I currently don't think it is infected.  My guess is that it is an inflamed cyst  If you have ongoing problems, see Dr. Amedeo Plenty.  Let me know if you need a referral.

## 2014-03-20 ENCOUNTER — Other Ambulatory Visit (HOSPITAL_COMMUNITY): Payer: Self-pay | Admitting: Obstetrics and Gynecology

## 2014-03-20 DIAGNOSIS — Z1231 Encounter for screening mammogram for malignant neoplasm of breast: Secondary | ICD-10-CM

## 2014-04-13 ENCOUNTER — Ambulatory Visit (HOSPITAL_COMMUNITY)
Admission: RE | Admit: 2014-04-13 | Discharge: 2014-04-13 | Disposition: A | Discharge status: Home / Self Care | Payer: 59 | Source: Ambulatory Visit | Attending: Obstetrics and Gynecology | Admitting: Obstetrics and Gynecology

## 2014-04-13 DIAGNOSIS — Z1231 Encounter for screening mammogram for malignant neoplasm of breast: Secondary | ICD-10-CM

## 2014-04-14 NOTE — Telephone Encounter (Signed)
Has this been done? In in my inbasket still pending

## 2014-04-17 NOTE — Telephone Encounter (Signed)
Yes, this was done 12/14.

## 2014-07-24 ENCOUNTER — Encounter: Payer: Self-pay | Admitting: Family Medicine

## 2014-12-18 ENCOUNTER — Encounter: Payer: Self-pay | Admitting: Family Medicine

## 2014-12-18 ENCOUNTER — Ambulatory Visit (INDEPENDENT_AMBULATORY_CARE_PROVIDER_SITE_OTHER): Payer: PRIVATE HEALTH INSURANCE | Admitting: Family Medicine

## 2014-12-18 VITALS — BP 120/78 | HR 72 | Wt 171.6 lb

## 2014-12-18 DIAGNOSIS — E559 Vitamin D deficiency, unspecified: Secondary | ICD-10-CM

## 2014-12-18 DIAGNOSIS — R002 Palpitations: Secondary | ICD-10-CM

## 2014-12-18 DIAGNOSIS — R5383 Other fatigue: Secondary | ICD-10-CM

## 2014-12-18 LAB — CBC WITH DIFFERENTIAL/PLATELET
BASOS ABS: 0.1 10*3/uL (ref 0.0–0.1)
Basophils Relative: 1 % (ref 0–1)
Eosinophils Absolute: 0.1 10*3/uL (ref 0.0–0.7)
Eosinophils Relative: 1 % (ref 0–5)
HCT: 41.1 % (ref 36.0–46.0)
HEMOGLOBIN: 13.7 g/dL (ref 12.0–15.0)
LYMPHS ABS: 1.8 10*3/uL (ref 0.7–4.0)
LYMPHS PCT: 24 % (ref 12–46)
MCH: 29.2 pg (ref 26.0–34.0)
MCHC: 33.3 g/dL (ref 30.0–36.0)
MCV: 87.6 fL (ref 78.0–100.0)
MPV: 9.5 fL (ref 8.6–12.4)
Monocytes Absolute: 0.4 10*3/uL (ref 0.1–1.0)
Monocytes Relative: 5 % (ref 3–12)
NEUTROS ABS: 5.2 10*3/uL (ref 1.7–7.7)
NEUTROS PCT: 69 % (ref 43–77)
Platelets: 266 10*3/uL (ref 150–400)
RBC: 4.69 MIL/uL (ref 3.87–5.11)
RDW: 13.2 % (ref 11.5–15.5)
WBC: 7.6 10*3/uL (ref 4.0–10.5)

## 2014-12-18 LAB — COMPREHENSIVE METABOLIC PANEL
ALT: 22 U/L (ref 0–35)
AST: 26 U/L (ref 0–37)
Albumin: 4.2 g/dL (ref 3.5–5.2)
Alkaline Phosphatase: 47 U/L (ref 39–117)
BILIRUBIN TOTAL: 0.3 mg/dL (ref 0.2–1.2)
BUN: 20 mg/dL (ref 6–23)
CALCIUM: 9.3 mg/dL (ref 8.4–10.5)
CHLORIDE: 103 meq/L (ref 96–112)
CO2: 24 mEq/L (ref 19–32)
CREATININE: 0.82 mg/dL (ref 0.50–1.10)
Glucose, Bld: 102 mg/dL — ABNORMAL HIGH (ref 70–99)
Potassium: 4.1 mEq/L (ref 3.5–5.3)
Sodium: 139 mEq/L (ref 135–145)
Total Protein: 6.6 g/dL (ref 6.0–8.3)

## 2014-12-18 LAB — TSH: TSH: 2.424 u[IU]/mL (ref 0.350–4.500)

## 2014-12-18 NOTE — Progress Notes (Signed)
Chief Complaint  Patient presents with  . multiple issues    multiple issues- not feeling well, when waking up at night and having that feeling of about to have a hot flash but don't have one. heart beat seems it goes up from 60 normally to 80-90's, tries reading or going to bathroom when this occurs. no other concerns    2 months ago she started training for a half marathon.  About a month ago, when running 5 miles 3x/week, started waking up "not feeling good".  She describes a sensation like "impending doom, something is wrong with me".  She would get up, have a bowel movement, felt the need to take a deep breath.  No chest pain, shortness of breath, dizziness.  Out of her normal routine to have to get up and have a BM in the middle of the night, but stool was otherwise normal.  HR 80-90's when feeling this way (resting is normally 50-60).  Last week it also started during the day, not just at night.  Saturday she ran 10 miles--fatigued throughout the day, but felt fine that night. Using non-caffeinated gels when running, staying well hydrated.  She reports she has previously seen cardiologist (looks per chart like it was 06/2005, she originally thought it was 2008) after she didn't feel well in a spin class.  She had echo with Dr. Lesle Reek, and heart monitor, stress test.  She was told not to get her HR above 150 (which she doesn't--keeps pace slow to avoid this), possibly something electrical they noted. No notes/records/studies are available for review in her electronic chart.  Feeling of impending doom feels similar to what she has when she gets hot flashes (just prior to onset of hot flash).  This feels similar, but never gets the flushing or hot flash, this feeling lasts longer. Symptoms have lasted about 2 hours.  Wakes up tired in the morning, due to interrupted sleep.  Review of labs from 05/2013--normla c-met, CBC.  Lipids were fine. Vit D was 33, but she had been taking a vitamin D  supplement along with MVI at that time (it had been low prior). TSH was normal   [place] 1/2 marathon is on April 16th.  PMH, PSH, SH and FH were reviewed.  Outpatient Encounter Prescriptions as of 12/18/2014  Medication Sig  . Multiple Vitamin (MULTIVITAMIN) tablet Take 1 tablet by mouth daily.  . [DISCONTINUED] cephALEXin (KEFLEX) 500 MG capsule Take 1 capsule (500 mg total) by mouth 3 (three) times daily.  . [DISCONTINUED] naproxen (NAPROSYN) 500 MG tablet Take 1 tablet (500 mg total) by mouth 2 (two) times daily with a meal. (Patient not taking: Reported on 12/18/2014)   No Known Allergies  ROS: no fever, chills, headaches, dizziness, chest pain, shortness of breath. No URI symptoms, allergy symptoms, nausea, vomiting, diarrhea, bleeding, bruising, rash. Moods are good, stress levels are stable/low. Denies joint pains or other problems except as noted in HPI.  PHYSICAL EXAM: BP 120/78 mmHg  Pulse 72  Wt 171 lb 9.6 oz (77.837 kg)  LMP 09/06/2011 Well developed, pleasant, well-appearing female in no distress HEENT: PERRL, EOMI, conjunctiva clear, OP clear Neck: no lymphadenopathy, thyromegaly or mass, no carotid bruit Heart: regular rate and rhythm without murmur, rub, gallop.  Occasional PVC/ectopic beat noted (intermittent, every 6-10 beats). Lungs: clear bilaterally Abdomen: soft, nontender, no mass or organomegaly Extremities: no edema, 2+ pulses Skin: no rash Psych: normal mood, affect, hygiene and grooming Neuro: alert and oriented.  Cranial  nerves intact. Normal strength, gait   EKG:  Sinus bradycardia, rate 50, no abnormalities noted.  ASSESSMENT/PLAN:  Palpitations - Plan: Comprehensive metabolic panel, CBC with Differential/Platelet, TSH  Other fatigue - Plan: Comprehensive metabolic panel, CBC with Differential/Platelet, Vit D  25 hydroxy (rtn osteoporosis monitoring), TSH  Vitamin D deficiency - Plan: Vit D  25 hydroxy (rtn osteoporosis monitoring)   c-met,  cbc, TSH, vitamin D  If labs normal, refer back to cardiology to eval. ? abnl findings on prior studies as reason to be told to keep HR down below 150?   While OV's are noted in Epic, there are no documents (OV's or studies) at all in the system.  Discussed proper hydration, nutrition with long runs. All questions answered.

## 2014-12-19 ENCOUNTER — Encounter: Payer: Self-pay | Admitting: Family Medicine

## 2014-12-19 DIAGNOSIS — E559 Vitamin D deficiency, unspecified: Secondary | ICD-10-CM | POA: Insufficient documentation

## 2014-12-19 LAB — VITAMIN D 25 HYDROXY (VIT D DEFICIENCY, FRACTURES): Vit D, 25-Hydroxy: 24 ng/mL — ABNORMAL LOW (ref 30–100)

## 2014-12-19 MED ORDER — VITAMIN D (ERGOCALCIFEROL) 1.25 MG (50000 UNIT) PO CAPS: 50000.0000 [IU] | ORAL_CAPSULE | ORAL | Status: DC

## 2014-12-19 NOTE — Addendum Note (Signed)
Addended by: Minette Headland A on: 12/19/2014 01:03 PM   Modules accepted: Orders

## 2014-12-27 ENCOUNTER — Encounter: Payer: Self-pay | Admitting: Internal Medicine

## 2015-01-09 ENCOUNTER — Encounter: Payer: Self-pay | Admitting: Family Medicine

## 2015-01-09 ENCOUNTER — Telehealth: Payer: Self-pay | Admitting: Internal Medicine

## 2015-01-09 NOTE — Telephone Encounter (Signed)
Pt called and states she is a new person being on the vitamin D. She did her half marathon this past weekend. And wants to thank you for everything. She is doing great on vitamin D

## 2015-03-12 ENCOUNTER — Other Ambulatory Visit (HOSPITAL_COMMUNITY): Payer: Self-pay | Admitting: Obstetrics and Gynecology

## 2015-03-12 DIAGNOSIS — Z1231 Encounter for screening mammogram for malignant neoplasm of breast: Secondary | ICD-10-CM

## 2015-04-30 ENCOUNTER — Ambulatory Visit (HOSPITAL_COMMUNITY)
Admission: RE | Admit: 2015-04-30 | Discharge: 2015-04-30 | Disposition: A | Discharge status: Home / Self Care | Payer: PRIVATE HEALTH INSURANCE | Source: Ambulatory Visit | Attending: Obstetrics and Gynecology | Admitting: Obstetrics and Gynecology

## 2015-04-30 DIAGNOSIS — Z1231 Encounter for screening mammogram for malignant neoplasm of breast: Secondary | ICD-10-CM | POA: Insufficient documentation

## 2015-07-09 ENCOUNTER — Ambulatory Visit (INDEPENDENT_AMBULATORY_CARE_PROVIDER_SITE_OTHER): Payer: PRIVATE HEALTH INSURANCE | Admitting: Family Medicine

## 2015-07-09 ENCOUNTER — Encounter: Payer: Self-pay | Admitting: Family Medicine

## 2015-07-09 VITALS — BP 122/78 | HR 72 | Temp 97.6°F | Resp 13 | Ht 69.0 in | Wt 167.2 lb

## 2015-07-09 DIAGNOSIS — E559 Vitamin D deficiency, unspecified: Secondary | ICD-10-CM

## 2015-07-09 DIAGNOSIS — E7800 Pure hypercholesterolemia, unspecified: Secondary | ICD-10-CM

## 2015-07-09 DIAGNOSIS — Z0000 Encounter for general adult medical examination without abnormal findings: Secondary | ICD-10-CM

## 2015-07-09 LAB — LIPID PANEL
Cholesterol: 228 mg/dL — ABNORMAL HIGH (ref 125–200)
HDL: 83 mg/dL
LDL Cholesterol: 131 mg/dL — ABNORMAL HIGH
Total CHOL/HDL Ratio: 2.7 ratio
Triglycerides: 70 mg/dL
VLDL: 14 mg/dL

## 2015-07-09 LAB — GLUCOSE, RANDOM: Glucose, Bld: 79 mg/dL (ref 65–99)

## 2015-07-09 NOTE — Progress Notes (Signed)
Chief Complaint  Patient presents with  . Annual Exam    gets eyes checked at Dr. Kathrin Penner. Gets PAP and breast/pelvic exam done at a separate OBGYN.   Annette Marks is a 54 y.o. female who presents for a complete physical. She sees Dr. Cletis Media for her GYN care, but is asking for exam to be done here today (she doesn't have appointment scheduled with Dr. Cletis Media). She has no particular concerns.  She is engaged to be married, getting married the weekend after Thanksgiving.  She recently moved in with her fiance, to [place].  Immunization History  Administered Date(s) Administered  . Hepatitis B 02/20/1985, 04/22/1985, 04/20/1992  . Influenza Whole 06/12/2011, 06/08/2013  . PPD Test 12/22/2011, 01/07/2012, 08/22/2013  . Td 01/01/2004  . Tdap 12/22/2011   Last Pap smear: last year with Dr. Cletis Media, 06/2014 Last mammogram: 04/2015 Last colonoscopy: 04/2011, Dr. Watt Climes, polyps (?hyperplastic)--told to f/u in 10 years  Last DEXA: normal through school--heel screen (8 years ago)  Dentist: 2-3 times yearly  Ophtho: wears glasses, goes every 2 years (went a few months ago) Exercise: hour weight lifting 3x/week, walks 3-6 miles 3x/week, and running 2-3 days/week   Past Medical History  Diagnosis Date  . Urethrocele, female     h/o normal cystoscopy (Dr. Serita Butcher)  . Vitamin D deficiency 2010    23 by Dr. Theda Sers, normal after taking supplements (recurrent in 2016)  . H/O varicella   . H/O mumps     Past Surgical History  Procedure Laterality Date  . Knee surgery  1979, 1997    left--torn ligaments/acl  . Tubal ligation    . Wisdom tooth extraction  1979    Social History   Social History  . Marital Status: Divorced    Spouse Name: N/A  . Number of Children: 2  . Years of Education: N/A   Occupational History  . RN [employer]   Social History Main Topics  . Smoking status: Never Smoker   . Smokeless tobacco: Never Used  . Alcohol Use: Yes     Comment: 2-3  drinks per month.  . Drug Use: No  . Sexual Activity: Yes    Birth Control/ Protection: Surgical     Comment: BTL   Other Topics Concern  . Not on file   Social History Narrative   Engaged, living with Fiance in [place]. Getting married 07/2015. Daughter is Engineer, maintenance (IT) in [place], son in [place]. No pets. RN at [employer].  Got degree in nursing informatics from [employer] History  Problem Relation Age of Onset  . Hyperlipidemia Father   . Hypertension Father   . Immunodeficiency Mother     thrombocythemia  . Arthritis Mother     joint pains; on methotrexate, sees rheum.  NOT RA  . Sjogren's syndrome Mother   . Osteoporosis Mother   . Raynaud syndrome Mother   . Hypertension Mother   . Diabetes Neg Hx   . Cancer Neg Hx   . Depression Sister   . Anxiety disorder Sister     Outpatient Encounter Prescriptions as of 07/09/2015  Medication Sig  . cholecalciferol (VITAMIN D) 1000 UNITS tablet Take 1,000 Units by mouth daily.  . [DISCONTINUED] Vitamin D, Ergocalciferol, (DRISDOL) 50000 UNITS CAPS capsule Take 1 capsule (50,000 Units total) by mouth every 7 (seven) days.  . [DISCONTINUED] Multiple Vitamin (MULTIVITAMIN) tablet Take 1 tablet by mouth daily.   No facility-administered encounter medications on file as of  07/09/2015.    No Known Allergies  ROS: The patient denies anorexia, fever, weight changes; denies headaches, vision changes, decreased hearing, ear pain, sore throat, breast concerns, chest pain, palpitations, dizziness, syncope, dyspnea on exertion, cough, swelling, nausea, vomiting, diarrhea, constipation, abdominal pain, melena, hematochezia, indigestion/heartburn, hematuria, incontinence, dysuria, vaginal bleeding, discharge, odor or itch, genital lesions, joint pains, numbness, tingling, weakness, tremor, suspicious skin lesions, depression, anxiety, abnormal bleeding/bruising, or enlarged lymph nodes. Sees derm (Dr. Wilhemina Bonito) yearly (December is next  scheduled appointment)--has a couple of dry patches to show him (L arm and L calf). Hot flashes are tolerable. Short-lived sadness and palpitations just prior to a hot flash, less often.   PHYSICAL EXAM:  BP 122/78 mmHg  Pulse 72  Temp(Src) 97.6 F (36.4 C) (Tympanic)  Resp 13  Ht 5\' 9"  (1.753 m)  Wt 167 lb 3.2 oz (75.841 kg)  BMI 24.68 kg/m2  LMP 09/06/2011  Height and weight were with her sneakers on.  General Appearance:  Alert, cooperative, no distress, appears stated age   Head:  Normocephalic, without obvious abnormality, atraumatic   Eyes:  PERRL, conjunctiva/corneas clear, EOM's intact, fundi  benign   Ears:  Normal TM's and external ear canals   Nose:  Nares normal, mucosa normal, no drainage or sinus tenderness   Throat:  Lips, mucosa, and tongue normal; teeth and gums normal   Neck:  Supple, no lymphadenopathy; thyroid: no enlargement/tenderness/nodules; no carotid  bruit or JVD   Back:  Spine nontender, no curvature, ROM normal, no CVA tenderness   Lungs:  Clear to auscultation bilaterally without wheezes, rales or ronchi; respirations unlabored   Chest Wall:  No tenderness or deformity   Heart:  Regular rate and rhythm, S1 and S2 normal, no murmur, rub  or gallop   Breast Exam:  No skin dimpling, nipple inversion, nipple discharge, breast mass or axillary lymphadenopathy  Abdomen:  Soft, non-tender, nondistended, normoactive bowel sounds,  no masses, no hepatosplenomegaly   Genitalia:  Normal external genitalia.  Very small bump/nodule at inferior portion of urethra.  Non inflamed.  BUS and vagina normal. No cervical motion tenderness. Uterus and adnexa are nontender, not enlarged no mass  Rectal:  Normal sphincter tone, no mass.  Heme negative, light brown stool  Extremities:  No clubbing, cyanosis or edema   Pulses:  2+ and symmetric all extremities   Skin:  Skin color, texture, turgor normal, no rashes.  Healing surgical scar  on forehead  Lymph nodes:  Cervical, supraclavicular, and axillary nodes normal   Neurologic:  CNII-XII intact, normal strength, sensation and gait; reflexes 2+ and symmetric throughout   Psych: Normal mood, affect, hygiene and grooming.         Chemistry      Component Value Date/Time   NA 139 12/18/2014 0001   K 4.1 12/18/2014 0001   CL 103 12/18/2014 0001   CO2 24 12/18/2014 0001   BUN 20 12/18/2014 0001   CREATININE 0.82 12/18/2014 0001      Component Value Date/Time   CALCIUM 9.3 12/18/2014 0001   ALKPHOS 47 12/18/2014 0001   AST 26 12/18/2014 0001   ALT 22 12/18/2014 0001   BILITOT 0.3 12/18/2014 0001     (nonfasting glucose 102)  Lab Results  Component Value Date   WBC 7.6 12/18/2014   HGB 13.7 12/18/2014   HCT 41.1 12/18/2014   MCV 87.6 12/18/2014   PLT 266 12/18/2014   Vitamin D-OH 24 (11/2014)  Lab Results  Component Value  Date   TSH 2.424 12/18/2014    ASSESSMENT/PLAN:  Annual physical exam - Plan: Vit D  25 hydroxy (rtn osteoporosis monitoring), Lipid panel, Glucose, random  Vitamin D deficiency - recheck today. continue longterm supplementation - Plan: Vit D  25 hydroxy (rtn osteoporosis monitoring)  Pure hypercholesterolemia - excellent HDL and LDL at goal in the past. Recheck today - Plan: Lipid panel   Discussed monthly self breast exams and yearly mammograms; at least 30 minutes of aerobic activity at least 5 days/week; proper sunscreen use reviewed; healthy diet, including goals of calcium and vitamin D intake and alcohol recommendations (less than or equal to 1 drink/day) reviewed; regular seatbelt use; changing batteries in smoke detectors. Immunization recommendations discussed--UTD. Briefly discussed zostavax, although likely not covered until age 8. Colonoscopy recommendations reviewed--UTD    Vit D, fasting glucose and lipids today (rest of routine labs were normal in March).   F/u 1 year, sooner prn

## 2015-07-09 NOTE — Patient Instructions (Signed)

## 2015-07-10 LAB — VITAMIN D 25 HYDROXY (VIT D DEFICIENCY, FRACTURES): Vit D, 25-Hydroxy: 30 ng/mL (ref 30–100)

## 2016-02-04 ENCOUNTER — Telehealth: Payer: Self-pay | Admitting: Internal Medicine

## 2016-02-04 NOTE — Telephone Encounter (Signed)
Pt would like you to call her back, would not disclose any information about call. Not urgent. Would like call back.

## 2016-02-04 NOTE — Telephone Encounter (Signed)
ok 

## 2016-02-04 NOTE — Telephone Encounter (Signed)
Called patient back and her son is engaged to [employer], she just moved here from [place] and she is currently working for [employer] and has [employer]. She is 55 years old and in good health and has UMR insurance. Manvitha said that she knows you are not taking new patient but you also knows how she feels about you and our entire office and wanted to see if there was any chance that you could see her. Thanks.

## 2016-02-04 NOTE — Telephone Encounter (Signed)
Can you add this name to the list of okay's to see Dr.Knapp-thanks.

## 2016-02-05 NOTE — Telephone Encounter (Signed)
done

## 2016-03-31 ENCOUNTER — Other Ambulatory Visit: Payer: Self-pay | Admitting: Family Medicine

## 2016-03-31 DIAGNOSIS — Z1231 Encounter for screening mammogram for malignant neoplasm of breast: Secondary | ICD-10-CM

## 2016-05-06 ENCOUNTER — Ambulatory Visit
Admission: RE | Admit: 2016-05-06 | Discharge: 2016-05-06 | Disposition: A | Discharge status: Home / Self Care | Payer: 59 | Source: Ambulatory Visit | Attending: Family Medicine | Admitting: Family Medicine

## 2016-05-06 DIAGNOSIS — Z1231 Encounter for screening mammogram for malignant neoplasm of breast: Secondary | ICD-10-CM

## 2016-06-05 ENCOUNTER — Telehealth: Payer: Self-pay | Admitting: Family Medicine

## 2016-07-10 ENCOUNTER — Telehealth: Payer: Self-pay | Admitting: Family Medicine

## 2016-07-10 NOTE — Telephone Encounter (Signed)
error 

## 2016-07-10 NOTE — Telephone Encounter (Signed)
Error

## 2016-07-13 ENCOUNTER — Encounter: Payer: Self-pay | Admitting: Family Medicine

## 2016-07-13 NOTE — Progress Notes (Signed)
Chief Complaint  Patient presents with  . Annual Exam    fasting annual exam with pelvic. Did not do eye exam, just saw Dr. Dagoberto Ligas. No concerns.    Annette Marks is a 55 y.o. female who presents for a complete physical.  She has no concerns.  She is training for the [facility] half marathon in a few weeks, and denies any problems.  Immunization History  Administered Date(s) Administered  . Hepatitis B 02/20/1985, 04/22/1985, 04/20/1992  . Influenza Whole 06/12/2011, 06/08/2013  . Influenza,inj,Quad PF,36+ Mos 06/01/2015  . PPD Test 12/22/2011, 01/07/2012, 08/22/2013  . Td 01/01/2004  . Tdap 12/22/2011  had flu shot at work Last Pap smear: last year with Dr. Estanislado Pandy, 06/2014 Last mammogram: 04/2016 Last colonoscopy: 04/2011, Dr. Ewing Schlein, polyps (?hyperplastic)--told to f/u in 10 years  Last DEXA: normal through school--heel screen (9 years ago)  Dentist: 2 times yearly  Ophtho: wears glasses, goes every 2 years (Dr. Dagoberto Ligas), last 04/2016. Exercise: hour weight lifting 3x/week, and running 3 days/week (  Past Medical History:  Diagnosis Date  . H/O mumps   . H/O varicella   . Urethrocele, female    h/o normal cystoscopy (Dr. Aldean Ast)  . Vitamin D deficiency 2010   23 by Dr. Thomasena Edis, normal after taking supplements (recurrent in 2016)    Past Surgical History:  Procedure Laterality Date  . KNEE SURGERY  1979, 1997   left--torn ligaments/acl  . MOHS SURGERY  2016   forehead (Dr. Irene Limbo)  . TUBAL LIGATION    . WISDOM TOOTH EXTRACTION  1979    Social History   Social History  . Marital status: Divorced    Spouse name: N/A  . Number of children: 2  . Years of education: N/A   Occupational History  . RN [employer]   Social History Main Topics  . Smoking status: Never Smoker  . Smokeless tobacco: Never Used  . Alcohol use Yes     Comment: 2-3 drinks per month.  . Drug use: No  . Sexual activity: Yes    Birth control/ protection: Surgical     Comment: BTL   Other Topics Concern  . Not on file   Social History Narrative   Re-married 07/2015 (lives in [place]). Daughter is IT trainer in [place], son in [place] (works in [place]). No pets. 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 Hyperlipidemia Father   . Hypertension Father   . Immunodeficiency Mother     thrombocythemia  . Arthritis Mother     joint pains; prev on methotrexate, sees rheum.  NOT RA  . Sjogren's syndrome Mother   . Osteoporosis Mother   . Raynaud syndrome Mother   . Hypertension Mother   . Depression Sister   . Anxiety disorder Sister   . Diabetes Neg Hx   . Cancer Neg Hx     Outpatient Encounter Prescriptions as of 07/14/2016  Medication Sig Note  . cholecalciferol (VITAMIN D) 1000 UNITS tablet Take 1,000 Units by mouth daily. 07/14/2016: Hasn't taken since the summer, when she had more sun exposure.   No facility-administered encounter medications on file as of 07/14/2016.     No Known Allergies  ROS: The patient denies anorexia, fever, weight changes; denies headaches, vision changes, decreased hearing, ear pain, sore throat, breast concerns, chest pain, palpitations, dizziness, syncope, dyspnea on exertion, cough, swelling, nausea, vomiting, diarrhea, constipation,  abdominal pain, melena, hematochezia, indigestion/heartburn, hematuria, incontinence, dysuria, vaginal bleeding, discharge, odor or itch, genital lesions, joint pains, numbness, tingling, weakness, tremor, suspicious skin lesions, depression, anxiety, abnormal bleeding/bruising, or enlarged lymph nodes. Sees derm (Dr. Wilhemina Bonito) yearly (in December)  Hot flashes are tolerable. Short-lived sadness and palpitations just prior to a hot flash, less often.   PHYSICAL EXAM:  BP 120/70 (BP Location: Left Arm, Patient Position: Sitting, Cuff Size: Normal)   Pulse (!) 56   Ht 5' 8.25" (1.734 m)   Wt 169 lb 12.8 oz  (77 kg)   LMP 09/06/2011   BMI 25.63 kg/m    General Appearance:  Alert, cooperative, no distress, appears stated age   Head:  Normocephalic, without obvious abnormality, atraumatic   Eyes:  PERRL, conjunctiva/corneas clear, EOM's intact, fundi benign   Ears:  Normal TM's and external ear canals   Nose:  Nares normal, mucosa normal, no drainage or sinus tenderness   Throat:  Lips, mucosa, and tongue normal; teeth and gums normal   Neck:  Supple, no lymphadenopathy; thyroid: no enlargement/tenderness/nodules; no carotid  bruit or JVD   Back:  Spine nontender, no curvature, ROM normal, no CVA tenderness   Lungs:  Clear to auscultation bilaterally without wheezes, rales or ronchi; respirations unlabored   Chest Wall:  No tenderness or deformity   Heart:  Regular rate and rhythm, S1 and S2 normal, no murmur, rub  or gallop   Breast Exam:  No skin dimpling, nipple inversion, nipple discharge, breast mass or axillary lymphadenopathy  Abdomen:  Soft, non-tender, nondistended, normoactive bowel sounds, no masses, no hepatosplenomegaly   Genitalia:  Normal external genitalia. BUS and vagina normal. No cervical motion tenderness. Uterus and adnexa are nontender, not enlarged no mass  Rectal:  Normal sphincter tone, no mass.  Heme negative, light brown stool  Extremities:  No clubbing, cyanosis or edema   Pulses:  2+ and symmetric all extremities   Skin:  Skin color, texture, turgor normal, no rashes.    Lymph nodes:  Cervical, supraclavicular, and axillary nodes normal   Neurologic:  CNII-XII intact, normal strength, sensation and gait; reflexes 2+ and symmetric throughout   Psych: Normal mood, affect, hygiene and grooming   ASSESSMENT/PLAN:  Annual physical exam - Plan: Lipid panel, Comprehensive metabolic panel, CBC with Differential/Platelet, VITAMIN D 25 Hydroxy (Vit-D Deficiency, Fractures), TSH  Vitamin D deficiency - not taking OTC  supplements over the summer; advised to restart 1000 IU daily - Plan: VITAMIN D 25 Hydroxy (Vit-D Deficiency, Fractures)  Pure hypercholesterolemia - good HDL, LDL has been fine in the past.  Recheck today - Plan: Lipid panel   c-met, CBC, TSH, Vit D, lipid  Discussed monthly self breast exams and yearly mammograms; at least 30 minutes of aerobic activity at least 5 days/week, weight-bearing exercise at least 2x/week; proper sunscreen use reviewed; healthy diet, including goals of calcium and vitamin D intake and alcohol recommendations (less than or equal to 1 drink/day) reviewed; regular seatbelt use; changing batteries in smoke detectors. Immunization recommendations discussed--UTD. Continue yearly flu shots. Briefly discussed zostavax, although likely not covered until age 68. Colonoscopy recommendations reviewed--UTD   F/u 1 year, sooner prn.

## 2016-07-14 ENCOUNTER — Ambulatory Visit (INDEPENDENT_AMBULATORY_CARE_PROVIDER_SITE_OTHER): Payer: Managed Care, Other (non HMO) | Admitting: Family Medicine

## 2016-07-14 ENCOUNTER — Encounter: Payer: Self-pay | Admitting: Family Medicine

## 2016-07-14 VITALS — BP 120/70 | HR 56 | Ht 68.25 in | Wt 169.8 lb

## 2016-07-14 DIAGNOSIS — E559 Vitamin D deficiency, unspecified: Secondary | ICD-10-CM | POA: Diagnosis not present

## 2016-07-14 DIAGNOSIS — E7800 Pure hypercholesterolemia, unspecified: Secondary | ICD-10-CM | POA: Diagnosis not present

## 2016-07-14 DIAGNOSIS — Z0000 Encounter for general adult medical examination without abnormal findings: Secondary | ICD-10-CM

## 2016-07-14 LAB — POCT URINALYSIS DIPSTICK
Bilirubin, UA: NEGATIVE
Glucose, UA: NEGATIVE
KETONES UA: NEGATIVE
Leukocytes, UA: NEGATIVE
Nitrite, UA: NEGATIVE
PROTEIN UA: NEGATIVE
RBC UA: NEGATIVE
Urobilinogen, UA: NEGATIVE
pH, UA: 6

## 2016-07-14 LAB — CBC WITH DIFFERENTIAL/PLATELET
BASOS PCT: 1 %
Basophils Absolute: 52 cells/uL (ref 0–200)
EOS PCT: 1 %
Eosinophils Absolute: 52 cells/uL (ref 15–500)
HCT: 41.6 % (ref 35.0–45.0)
Hemoglobin: 14 g/dL (ref 11.7–15.5)
LYMPHS PCT: 30 %
Lymphs Abs: 1560 cells/uL (ref 850–3900)
MCH: 29.1 pg (ref 27.0–33.0)
MCHC: 33.7 g/dL (ref 32.0–36.0)
MCV: 86.5 fL (ref 80.0–100.0)
MONOS PCT: 7 %
MPV: 9.2 fL (ref 7.5–12.5)
Monocytes Absolute: 364 cells/uL (ref 200–950)
NEUTROS ABS: 3172 {cells}/uL (ref 1500–7800)
Neutrophils Relative %: 61 %
PLATELETS: 240 10*3/uL (ref 140–400)
RBC: 4.81 MIL/uL (ref 3.80–5.10)
RDW: 12.9 % (ref 11.0–15.0)
WBC: 5.2 10*3/uL (ref 4.0–10.5)

## 2016-07-14 LAB — TSH: TSH: 1.9 mIU/L

## 2016-07-14 NOTE — Addendum Note (Signed)
Addended by: Carolee Rota F on: 07/14/2016 11:14 AM   Modules accepted: Orders

## 2016-07-14 NOTE — Patient Instructions (Signed)
  HEALTH MAINTENANCE RECOMMENDATIONS:  It is recommended that you get at least 30 minutes of aerobic exercise at least 5 days/week (for weight loss, you may need as much as 60-90 minutes). This can be any activity that gets your heart rate up. This can be divided in 10-15 minute intervals if needed, but try and build up your endurance at least once a week.  Weight bearing exercise is also recommended twice weekly.  Eat a healthy diet with lots of vegetables, fruits and fiber.  "Colorful" foods have a lot of vitamins (ie green vegetables, tomatoes, red peppers, etc).  Limit sweet tea, regular sodas and alcoholic beverages, all of which has a lot of calories and sugar.  Up to 1 alcoholic drink daily may be beneficial for women (unless trying to lose weight, watch sugars).  Drink a lot of water.  Calcium recommendations are 1200-1500 mg daily (1500 mg for postmenopausal women or women without ovaries), and vitamin D 1000 IU daily.  This should be obtained from diet and/or supplements (vitamins), and calcium should not be taken all at once, but in divided doses.  Monthly self breast exams and yearly mammograms for women over the age of 45 is recommended.  Sunscreen of at least SPF 30 should be used on all sun-exposed parts of the skin when outside between the hours of 10 am and 4 pm (not just when at beach or pool, but even with exercise, golf, tennis, and yard work!)  Use a sunscreen that says "broad spectrum" so it covers both UVA and UVB rays, and make sure to reapply every 1-2 hours.  Remember to change the batteries in your smoke detectors when changing your clock times in the spring and fall.  Use your seat belt every time you are in a car, and please drive safely and not be distracted with cell phones and texting while driving.  Restart your Vitamin D 1000 IU daily.  I will let you know if you need more than that once your labs results are back.  Good luck in [place]!

## 2016-07-15 LAB — VITAMIN D 25 HYDROXY (VIT D DEFICIENCY, FRACTURES): VIT D 25 HYDROXY: 29 ng/mL — AB (ref 30–100)

## 2016-07-15 LAB — COMPREHENSIVE METABOLIC PANEL
ALBUMIN: 4.2 g/dL (ref 3.6–5.1)
ALK PHOS: 46 U/L (ref 33–130)
ALT: 24 U/L (ref 6–29)
AST: 27 U/L (ref 10–35)
BILIRUBIN TOTAL: 0.6 mg/dL (ref 0.2–1.2)
BUN: 13 mg/dL (ref 7–25)
CALCIUM: 9.3 mg/dL (ref 8.6–10.4)
CO2: 24 mmol/L (ref 20–31)
CREATININE: 0.82 mg/dL (ref 0.50–1.05)
Chloride: 106 mmol/L (ref 98–110)
Glucose, Bld: 98 mg/dL (ref 65–99)
Potassium: 4.3 mmol/L (ref 3.5–5.3)
SODIUM: 139 mmol/L (ref 135–146)
TOTAL PROTEIN: 6.7 g/dL (ref 6.1–8.1)

## 2016-07-15 LAB — LIPID PANEL
CHOL/HDL RATIO: 2.5 ratio (ref <=5.0)
CHOLESTEROL: 237 mg/dL — AB (ref 125–200)
HDL: 94 mg/dL (ref >=46)
LDL Cholesterol: 132 mg/dL — ABNORMAL HIGH (ref <130)
Triglycerides: 54 mg/dL (ref <150)
VLDL: 11 mg/dL (ref <30)

## 2016-07-31 ENCOUNTER — Encounter: Payer: PRIVATE HEALTH INSURANCE | Admitting: Family Medicine

## 2016-09-12 ENCOUNTER — Encounter: Payer: Self-pay | Admitting: Family Medicine

## 2017-03-26 ENCOUNTER — Other Ambulatory Visit: Payer: Self-pay | Admitting: Family Medicine

## 2017-03-26 DIAGNOSIS — Z1231 Encounter for screening mammogram for malignant neoplasm of breast: Secondary | ICD-10-CM

## 2017-05-07 ENCOUNTER — Ambulatory Visit
Admission: RE | Admit: 2017-05-07 | Discharge: 2017-05-07 | Disposition: A | Discharge status: Home / Self Care | Payer: Managed Care, Other (non HMO) | Source: Ambulatory Visit | Attending: Family Medicine | Admitting: Family Medicine

## 2017-05-07 DIAGNOSIS — Z1231 Encounter for screening mammogram for malignant neoplasm of breast: Secondary | ICD-10-CM

## 2017-07-15 NOTE — Progress Notes (Signed)
Chief Complaint  Patient presents with  . Annual Exam    fasting annual with pap or pelvic exam (TBD). Just had eye exam. No concerns.     Annette Marks is a 56 y.o. female who presents for a complete physical.  She has no complaints.  She ran the [place] 1/2 marathon last year, and this year is running the [place] in [place].  She has no pain or complaints.  Daughter was recently diagnosed with low thyroid, so she would like her thyroid checked today, along with some other labs--lipids and CBC (due to her mother's platelet issue).  Immunization History  Administered Date(s) Administered  . Hepatitis B 02/20/1985, 04/22/1985, 04/20/1992  . Influenza Whole 06/12/2011, 06/08/2013  . Influenza,inj,Quad PF,6+ Mos 06/01/2015  . Influenza-Unspecified 06/26/2016  . PPD Test 12/22/2011, 01/07/2012, 08/22/2013  . Td 01/01/2004  . Tdap 12/22/2011   had flu shot at work Last Pap smear: 06/2014 with Dr. Cletis Media Last mammogram: 04/2017 Last colonoscopy: 04/2011, Dr. Watt Climes, polyps (?hyperplastic)--told to f/u in 10 years  Last DEXA: normal through school--heel screen (10 years ago)  Dentist: 4 times yearly  Ophtho: wears glasses, goes every 2 years (Dr. Kathrin Penner), went in April, 2018 per pt. Exercise: hour weight lifting 3x/week, and running 3 days/week Vitamin D screen, level of 29 06/2016 Lipid screen: Lab Results  Component Value Date   CHOL 237 (H) 07/14/2016   HDL 94 07/14/2016   LDLCALC 132 (H) 07/14/2016   TRIG 54 07/14/2016   CHOLHDL 2.5 07/14/2016    Past Medical History:  Diagnosis Date  . H/O mumps   . H/O varicella   . Urethrocele, female    h/o normal cystoscopy (Dr. Serita Butcher)  . Vitamin D deficiency 2010   23 by Dr. Theda Sers, normal after taking supplements (recurrent in 2016)    Past Surgical History:  Procedure Laterality Date  . [place]   left--torn ligaments/acl  . MOHS SURGERY  2016   forehead (Dr. Sarajane Jews)  . TUBAL  LIGATION    . [place] EXTRACTION  1979    Social History   Social History  . Marital status: Married    Spouse name: N/A  . Number of children: 2  . Years of education: N/A   Occupational History  . RN [employer]   Social History Main Topics  . Smoking status: Never Smoker  . Smokeless tobacco: Never Used  . Alcohol use Yes     Comment: stopped drinking (fewer hot flashes without alcohol)  . Drug use: No  . Sexual activity: Yes    Birth control/ protection: Surgical     Comment: BTL   Other Topics Concern  . Not on file   Social History Narrative   Re-married 07/2015 (lives in [place]). Daughter is Engineer, maintenance (IT) in [place], son in [place] (works in [place]). No pets. RN at [employer].  Got degree in nursing informatics from [employer] History  Problem Relation Age of Onset  . Hyperlipidemia Father   . Hypertension Father   . Immunodeficiency Mother        thrombocythemia  . Arthritis Mother        joint pains; prev on methotrexate, sees rheum.  NOT RA  . Sjogren's syndrome Mother   . Osteoporosis Mother   . Raynaud syndrome Mother   . Hypertension Mother   . Depression Sister   . Anxiety disorder Sister   . Hypothyroidism Daughter   .  Hypothyroidism Paternal Grandmother   . Diabetes Neg Hx   . Cancer Neg Hx     Outpatient Encounter Prescriptions as of 07/16/2017  Medication Sig Note  . cholecalciferol (VITAMIN D) 1000 UNITS tablet Take 1,000 Units by mouth daily. 07/16/2017: Uses sporadically, if hasn't been in the sun for a while (a week at a time); last took 3 weeks ago   No facility-administered encounter medications on file as of 07/16/2017.     No Known Allergies   ROS: The patient denies anorexia, fever, weight changes; denies headaches, vision changes, decreased hearing, ear pain, sore throat, breast concerns, chest pain, palpitations, dizziness, syncope, dyspnea on exertion, cough, swelling, nausea, vomiting, diarrhea, constipation,  abdominal pain, melena, hematochezia, indigestion/heartburn, hematuria, incontinence, dysuria, vaginal bleeding, discharge, odor or itch, genital lesions, joint pains, numbness, tingling, weakness, tremor, suspicious skin lesions, depression, anxiety, abnormal bleeding/bruising, or enlarged lymph nodes. Sees derm (Dr. Wilhemina Bonito) yearly (in December)  Hot flashes are tolerable.  Improved since cutting out alcohol. Short-lived sadness and palpitations just prior to a hot flash, less often.    PHYSICAL EXAM:  BP 114/70   Pulse (!) 56   Ht 5\' 8"  (1.727 m)   Wt 170 lb (77.1 kg)   LMP 09/06/2011   BMI 25.85 kg/m   Wt Readings from Last 3 Encounters:  07/16/17 170 lb (77.1 kg)  07/14/16 169 lb 12.8 oz (77 kg)  07/09/15 167 lb 3.2 oz (75.8 kg)    General Appearance:  Alert, cooperative, no distress, appears stated age   Head:  Normocephalic, without obvious abnormality, atraumatic   Eyes:  PERRL, conjunctiva/corneas clear, EOM's intact, fundi benign   Ears:  Normal TM's and external ear canals   Nose:  Nares normal, mucosa normal, no drainage or sinus tenderness   Throat:  Lips, mucosa, and tongue normal; teeth and gums normal   Neck:  Supple, no lymphadenopathy; thyroid: no enlargement/tenderness/nodules; no carotid bruit or JVD   Back:  Spine nontender, no curvature, ROM normal, no CVA tenderness   Lungs:  Clear to auscultation bilaterally without wheezes, rales or ronchi; respirations unlabored   Chest Wall:  No tenderness or deformity   Heart:  Regular rate and rhythm, S1 and S2 normal, no murmur, rub or gallop   Breast Exam:  No skin dimpling, nipple inversion, nipple discharge, breast mass or axillary lymphadenopathy  Abdomen:  Soft, non-tender, nondistended, normoactive bowel sounds, no masses, no hepatosplenomegaly   Genitalia:  Normal external genitalia. BUS and vagina normal. No cervical motion tenderness, cervical discharge or lesions. Uterus and  adnexa are nontender, not enlarged no mass. Pap performed  Rectal:  Normal sphincter tone, no mass. Heme negative, light brown stool  Extremities:  No clubbing, cyanosis or edema   Pulses:  2+ and symmetric all extremities   Skin:  Skin color, texture, turgor normal, no rashes.   Lymph nodes:  Cervical, supraclavicular, and axillary nodes normal   Neurologic:  CNII-XII intact, normal strength, sensation and gait; reflexes 2+ and symmetric throughout   Psych: Normal mood, affect, hygiene and grooming   ASSESSMENT/PLAN:  Annual physical exam - Plan: POCT Urinalysis DIP (Proadvantage Device), Lipid panel, Comprehensive metabolic panel, CBC with Differential/Platelet, VITAMIN D 25 Hydroxy (Vit-D Deficiency, Fractures), TSH, Cytology - PAP [place]  Vitamin D deficiency - Plan: VITAMIN D 25 Hydroxy (Vit-D Deficiency, Fractures)  Pure hypercholesterolemia - Plan: Lipid panel   Discussed monthly self breast exams and yearly mammograms; at least 30 minutes of aerobic activity at least  5 days/week, weight-bearing exercise at least 2x/week; proper sunscreen use reviewed; healthy diet, including goals of calcium and vitamin D intake and alcohol recommendations (less than or equal to 1 drink/day) reviewed; regular seatbelt use; changing batteries in smoke detectors. Immunization recommendations discussed--UTD. Continue yearly flu shots. Shingrix recommended. Colonoscopy recommendations reviewed--UTD   F/u 1 year, sooner prn.

## 2017-07-16 ENCOUNTER — Encounter: Payer: Self-pay | Admitting: Family Medicine

## 2017-07-16 ENCOUNTER — Other Ambulatory Visit (HOSPITAL_COMMUNITY)
Admission: RE | Admit: 2017-07-16 | Discharge: 2017-07-16 | Disposition: A | Discharge status: Home / Self Care | Payer: Managed Care, Other (non HMO) | Source: Ambulatory Visit | Attending: Family Medicine | Admitting: Family Medicine

## 2017-07-16 ENCOUNTER — Ambulatory Visit (INDEPENDENT_AMBULATORY_CARE_PROVIDER_SITE_OTHER): Payer: Managed Care, Other (non HMO) | Admitting: Family Medicine

## 2017-07-16 VITALS — BP 114/70 | HR 56 | Ht 68.0 in | Wt 170.0 lb

## 2017-07-16 DIAGNOSIS — E559 Vitamin D deficiency, unspecified: Secondary | ICD-10-CM

## 2017-07-16 DIAGNOSIS — Z0000 Encounter for general adult medical examination without abnormal findings: Secondary | ICD-10-CM | POA: Insufficient documentation

## 2017-07-16 DIAGNOSIS — E7800 Pure hypercholesterolemia, unspecified: Secondary | ICD-10-CM

## 2017-07-16 LAB — POCT URINALYSIS DIP (PROADVANTAGE DEVICE)
BILIRUBIN UA: NEGATIVE
GLUCOSE UA: NEGATIVE mg/dL
Ketones, POC UA: NEGATIVE mg/dL
Leukocytes, UA: NEGATIVE
NITRITE UA: NEGATIVE
PH UA: 7 (ref 5.0–8.0)
Protein Ur, POC: NEGATIVE mg/dL
RBC UA: NEGATIVE
SPECIFIC GRAVITY, URINE: 1.015
UUROB: NEGATIVE

## 2017-07-16 NOTE — Patient Instructions (Signed)

## 2017-07-17 LAB — COMPREHENSIVE METABOLIC PANEL
AG Ratio: 1.6 (calc) (ref 1.0–2.5)
ALBUMIN MSPROF: 4 g/dL (ref 3.6–5.1)
ALT: 25 U/L (ref 6–29)
AST: 28 U/L (ref 10–35)
Alkaline phosphatase (APISO): 44 U/L (ref 33–130)
BILIRUBIN TOTAL: 0.6 mg/dL (ref 0.2–1.2)
BUN: 14 mg/dL (ref 7–25)
CALCIUM: 8.8 mg/dL (ref 8.6–10.4)
CO2: 24 mmol/L (ref 20–32)
CREATININE: 0.8 mg/dL (ref 0.50–1.05)
Chloride: 106 mmol/L (ref 98–110)
GLOBULIN: 2.5 g/dL (ref 1.9–3.7)
Glucose, Bld: 94 mg/dL (ref 65–99)
POTASSIUM: 4.2 mmol/L (ref 3.5–5.3)
Sodium: 138 mmol/L (ref 135–146)
TOTAL PROTEIN: 6.5 g/dL (ref 6.1–8.1)

## 2017-07-17 LAB — LIPID PANEL
CHOL/HDL RATIO: 2.6 (calc) (ref <5.0)
Cholesterol: 227 mg/dL — ABNORMAL HIGH (ref <200)
HDL: 86 mg/dL (ref >50)
LDL Cholesterol (Calc): 124 mg/dL (calc) — ABNORMAL HIGH
NON-HDL CHOLESTEROL (CALC): 141 mg/dL — AB (ref <130)
Triglycerides: 78 mg/dL (ref <150)

## 2017-07-17 LAB — VITAMIN D 25 HYDROXY (VIT D DEFICIENCY, FRACTURES): Vit D, 25-Hydroxy: 31 ng/mL (ref 30–100)

## 2017-07-17 LAB — CBC WITH DIFFERENTIAL/PLATELET
Basophils Absolute: 68 cells/uL (ref 0–200)
Basophils Relative: 1.7 %
EOS PCT: 1.2 %
Eosinophils Absolute: 48 cells/uL (ref 15–500)
HEMATOCRIT: 41 % (ref 35.0–45.0)
HEMOGLOBIN: 13.7 g/dL (ref 11.7–15.5)
LYMPHS ABS: 1364 {cells}/uL (ref 850–3900)
MCH: 28.7 pg (ref 27.0–33.0)
MCHC: 33.4 g/dL (ref 32.0–36.0)
MCV: 85.8 fL (ref 80.0–100.0)
MPV: 9.7 fL (ref 7.5–12.5)
Monocytes Relative: 9 %
NEUTROS ABS: 2160 {cells}/uL (ref 1500–7800)
NEUTROS PCT: 54 %
Platelets: 237 10*3/uL (ref 140–400)
RBC: 4.78 10*6/uL (ref 3.80–5.10)
RDW: 12 % (ref 11.0–15.0)
Total Lymphocyte: 34.1 %
WBC: 4 10*3/uL (ref 3.8–10.8)
WBCMIX: 360 {cells}/uL (ref 200–950)

## 2017-07-17 LAB — TSH: TSH: 1.7 m[IU]/L (ref 0.40–4.50)

## 2017-07-21 LAB — CYTOLOGY - PAP
Diagnosis: NEGATIVE
HPV: DETECTED — AB

## 2017-07-27 ENCOUNTER — Encounter: Payer: Self-pay | Admitting: Family Medicine

## 2018-02-10 ENCOUNTER — Encounter: Payer: Self-pay | Admitting: Family Medicine

## 2018-02-10 ENCOUNTER — Ambulatory Visit: Payer: Managed Care, Other (non HMO) | Admitting: Family Medicine

## 2018-02-10 VITALS — BP 120/70 | HR 72 | Temp 98.0°F | Ht 68.5 in | Wt 172.4 lb

## 2018-02-10 DIAGNOSIS — H6982 Other specified disorders of Eustachian tube, left ear: Secondary | ICD-10-CM | POA: Diagnosis not present

## 2018-02-10 NOTE — Patient Instructions (Signed)
Use an antihistamine such as claritin, allegra or zyrtec. I also recommend use of decongestant (ie sudafed)--either separately or combination (ie Claritin-D, Allegra-D). There is no evidence of infection. Eustachian tube dysfunction related to the congestion (cold/allergies) is the cause.   Eustachian Tube Dysfunction The eustachian tube connects the middle ear to the back of the nose. It regulates air pressure in the middle ear by allowing air to move between the ear and nose. It also helps to drain fluid from the middle ear space. When the eustachian tube does not function properly, air pressure, fluid, or both can build up in the middle ear. Eustachian tube dysfunction can affect one or both ears. What are the causes? This condition happens when the eustachian tube becomes blocked or cannot open normally. This may result from:  Ear infections.  Colds and other upper respiratory infections.  Allergies.  Irritation, such as from cigarette smoke or acid from the stomach coming up into the esophagus (gastroesophageal reflux).  Sudden changes in air pressure, such as from descending in an airplane.  Abnormal growths in the nose or throat, such as nasal polyps, tumors, or enlarged tissue at the back of the throat (adenoids).  What increases the risk? This condition may be more likely to develop in people who smoke and people who are overweight. Eustachian tube dysfunction may also be more likely to develop in children, especially children who have:  Certain birth defects of the mouth, such as cleft palate.  Large tonsils and adenoids.  What are the signs or symptoms? Symptoms of this condition may include:  A feeling of fullness in the ear.  Ear pain.  Clicking or popping noises in the ear.  Ringing in the ear.  Hearing loss.  Loss of balance.  Symptoms may get worse when the air pressure around you changes, such as when you travel to an area of high elevation or fly on  an airplane. How is this diagnosed? This condition may be diagnosed based on:  Your symptoms.  A physical exam of your ear, nose, and throat.  Tests, such as those that measure: ? The movement of your eardrum (tympanogram). ? Your hearing (audiometry).  How is this treated? Treatment depends on the cause and severity of your condition. If your symptoms are mild, you may be able to relieve your symptoms by moving air into ("popping") your ears. If you have symptoms of fluid in your ears, treatment may include:  Decongestants.  Antihistamines.  Nasal sprays or ear drops that contain medicines that reduce swelling (steroids).  In some cases, you may need to have a procedure to drain the fluid in your eardrum (myringotomy). In this procedure, a small tube is placed in the eardrum to:  Drain the fluid.  Restore the air in the middle ear space.  Follow these instructions at home:  Take over-the-counter and prescription medicines only as told by your health care provider.  Use techniques to help pop your ears as recommended by your health care provider. These may include: ? Chewing gum. ? Yawning. ? Frequent, forceful swallowing. ? Closing your mouth, holding your nose closed, and gently blowing as if you are trying to blow air out of your nose.  Do not do any of the following until your health care provider approves: ? Travel to high altitudes. ? Fly in airplanes. ? Work in a Pension scheme manager or room. ? Scuba dive.  Keep your ears dry. Dry your ears completely after showering or bathing.  Do  not smoke.  Keep all follow-up visits as told by your health care provider. This is important. Contact a health care provider if:  Your symptoms do not go away after treatment.  Your symptoms come back after treatment.  You are unable to pop your ears.  You have: ? A fever. ? Pain in your ear. ? Pain in your head or neck. ? Fluid draining from your ear.  Your hearing  suddenly changes.  You become very dizzy.  You lose your balance. This information is not intended to replace advice given to you by your health care provider. Make sure you discuss any questions you have with your health care provider. Document Released: 10/05/2015 Document Revised: 02/14/2016 Document Reviewed: 09/27/2014 Elsevier Interactive Patient Education  [employer].

## 2018-02-10 NOTE — Progress Notes (Signed)
Chief Complaint  Patient presents with  . Otalgia    left ear sounds of distant fireworks started 10 pm last night,     Last night she started with "fireworks" popping in her left ear last night. After blowing her nose, she felt pressure, like she needed to pop ear, which turned into pulsing into her ear, which then turned into frequent popping like distant fireworks.  She has not tried taking any medications for this.  She is going out of town this weekend, camping ([place], school trip).  She had a cold 4 weeks ago--acute onset, lowgrade fever. Lasted about a week, but has had some ongoing congestion, PND, nose-blowing.  Has used some benadryl at bedtime about 3x in the last month, (12.5mg ), no other OTC meds.  No itchy, watery eyes.  Tried bulb syringe with warm water last night, no improvement.  Slight headache this morning. No h/o ear problems, denies hearing loss.  PMH, PSH, SH reviewed  Outpatient Encounter Medications as of 02/10/2018  Medication Sig Note  . cholecalciferol (VITAMIN D) 1000 UNITS tablet Take 1,000 Units by mouth daily. 07/16/2017: Uses sporadically, if hasn't been in the sun for a while (a week at a time); last took 3 weeks ago   No facility-administered encounter medications on file as of 02/10/2018.    No Known Allergies  ROS: no fever, chills, dizziness/vertigo, cough, shortness of breath, chest pain, hearing loss.  +congestion/PND and ear symptoms per HPI.  No nausea, vomiting, bleeding, bruising, rash.   PHYSICAL EXAM:  BP 120/70   Pulse 72   Temp 98 F (36.7 C) (Oral)   Ht 5' 8.5" (1.74 m)   Wt 172 lb 6.4 oz (78.2 kg)   LMP 09/06/2011   BMI 25.83 kg/m   Well appearing, pleasant female in no distress. Frequent throat clearing noted during visit, intermittently HEENT: PERRL, EOMI, conjunctiva and sclera are clear.  Nasal mucosa is mild-mod edematous, clear mucus noted on the left. TM's and EAc's normal. OP is clear. Sinuses are  nontender Neck: no lymphadenopathy or mass Heart: regular rate and rhythm Lungs: clear Neuro: alert and oriented, cranial nerves intact, normal gait  ASSESSMENT/PLAN:  Dysfunction of left eustachian tube - suspect related to allergies (recent URI, resolved, likely now more allergies); supportive measures with antihistamines, decongestants.    Use an antihistamine such as claritin, allegra or zyrtec. I also recommend use of decongestant (ie sudafed)--either separately or combination (ie Claritin-D, Allegra-D). There is no evidence of infection. Eustachian tube dysfunction related to the congestion (cold/allergies) is the cause.

## 2018-03-09 ENCOUNTER — Ambulatory Visit: Payer: Managed Care, Other (non HMO) | Admitting: Medical

## 2018-03-09 VITALS — BP 120/80 | HR 68 | Temp 98.1°F | Resp 16 | Ht 68.0 in | Wt 172.6 lb

## 2018-03-09 DIAGNOSIS — L989 Disorder of the skin and subcutaneous tissue, unspecified: Secondary | ICD-10-CM | POA: Diagnosis not present

## 2018-03-09 DIAGNOSIS — W57XXXA Bitten or stung by nonvenomous insect and other nonvenomous arthropods, initial encounter: Secondary | ICD-10-CM

## 2018-03-09 MED ORDER — DOXYCYCLINE HYCLATE 100 MG PO TABS: 100.0000 mg | ORAL_TABLET | Freq: Two times a day (BID) | ORAL | 0 refills | Status: DC

## 2018-03-09 NOTE — Progress Notes (Signed)
Subjective: Chief Complaint  Patient presents with  . tick bite    tick bite X last wednesday  not engorged    Here for tick bite.   1 week ago was out in the garden, later that evening had stinging pain and found a tick attached to left outer labia.   She removed the tick, cleaned the area well with alcohol.  There has been a bump this week but last night saw a pustule in the area. Its still a little tender.  No pus drainage, no warmth, no rash otherwise.  No fever.  She has had some joint aches.   She has some stressful things at home, so wanted to knock this out so its not a problem.  No other aggravating or relieving factors. No other complaint.   Past Medical History:  Diagnosis Date  . H/O mumps   . H/O varicella   . Urethrocele, female    h/o normal cystoscopy (Dr. Serita Butcher)  . Vitamin D deficiency 2010   23 by Dr. Theda Sers, normal after taking supplements (recurrent in 2016)   Current Outpatient Medications on File Prior to Visit  Medication Sig Dispense Refill  . cholecalciferol (VITAMIN D) 1000 UNITS tablet Take 1,000 Units by mouth daily.     No current facility-administered medications on file prior to visit.    ROS as in subjective   Objective: BP 120/80   Pulse 68   Temp 98.1 F (36.7 C) (Oral)   Resp 16   Ht 5\' 8"  (1.727 m)   Wt 172 lb 9.6 oz (78.3 kg)   LMP 09/06/2011   SpO2 97%   BMI 26.24 kg/m   Gen: wd, wn, nad Skin: left mons adjacent to labia with small 2-28mm diameter pinkish red papular lesion.  Slight tender in same area.  No fluctuance, no warmth, no induration.  No lymphadenopathy Exam chaperoned by nurse   Assessment: Encounter Diagnoses  Name Primary?  . Tick bite, initial encounter Yes  . Skin lesion      Plan: discussed findings.   She had tick attached < 24 hours.  Can use benadryl oral QHS the next several days.  At this point we discussed soap and water hygiene, can begin neosporin ointment x 1 week, then change to OTC  Hydrocortisone cream for 1-2 weeks to the lesion.  However, if any worse redness, pain, induration or other, then begin doxycycline antibiotic.   Discussed symptom of lyme and RMSF that would prompt beginning medication doxycyline or recheck.  Currently she has localized papular lesion without obvious cellulitis or infection.  She voiced understanding and agreement of plan.    Maurine was seen today for tick bite.  Diagnoses and all orders for this visit:  Tick bite, initial encounter  Skin lesion  Other orders -     doxycycline (VIBRA-TABS) 100 MG tablet; Take 1 tablet (100 mg total) by mouth 2 (two) times daily.

## 2018-03-26 ENCOUNTER — Other Ambulatory Visit: Payer: Self-pay | Admitting: Family Medicine

## 2018-03-26 DIAGNOSIS — Z1231 Encounter for screening mammogram for malignant neoplasm of breast: Secondary | ICD-10-CM

## 2018-05-10 ENCOUNTER — Ambulatory Visit
Admission: RE | Admit: 2018-05-10 | Discharge: 2018-05-10 | Disposition: A | Discharge status: Home / Self Care | Payer: Managed Care, Other (non HMO) | Source: Ambulatory Visit | Attending: Family Medicine | Admitting: Family Medicine

## 2018-05-10 DIAGNOSIS — Z1231 Encounter for screening mammogram for malignant neoplasm of breast: Secondary | ICD-10-CM

## 2018-07-20 NOTE — Patient Instructions (Signed)
  HEALTH MAINTENANCE RECOMMENDATIONS:  It is recommended that you get at least 30 minutes of aerobic exercise at least 5 days/week (for weight loss, you may need as much as 60-90 minutes). This can be any activity that gets your heart rate up. This can be divided in 10-15 minute intervals if needed, but try and build up your endurance at least once a week.  Weight bearing exercise is also recommended twice weekly.  Eat a healthy diet with lots of vegetables, fruits and fiber.  "Colorful" foods have a lot of vitamins (ie green vegetables, tomatoes, red peppers, etc).  Limit sweet tea, regular sodas and alcoholic beverages, all of which has a lot of calories and sugar.  Up to 1 alcoholic drink daily may be beneficial for women (unless trying to lose weight, watch sugars).  Drink a lot of water.  Calcium recommendations are 1200-1500 mg daily (1500 mg for postmenopausal women or women without ovaries), and vitamin D 1000 IU daily.  This should be obtained from diet and/or supplements (vitamins), and calcium should not be taken all at once, but in divided doses.  Monthly self breast exams and yearly mammograms for women over the age of 80 is recommended.  Sunscreen of at least SPF 30 should be used on all sun-exposed parts of the skin when outside between the hours of 10 am and 4 pm (not just when at beach or pool, but even with exercise, golf, tennis, and yard work!)  Use a sunscreen that says "broad spectrum" so it covers both UVA and UVB rays, and make sure to reapply every 1-2 hours.  Remember to change the batteries in your smoke detectors when changing your clock times in the spring and fall.  Use your seat belt every time you are in a car, and please drive safely and not be distracted with cell phones and texting while driving.  I recommend getting the new shingles vaccine (Shingrix). You will need to check with your insurance to see if it is covered, and if covered, schedule a nurse visit to get  from our office.  It is a series of 2 injections, spaced 2 months apart.

## 2018-07-20 NOTE — Progress Notes (Signed)
Chief Complaint  Patient presents with  . Annual Exam   [place] is a 57 y.o. female who presents for a complete physical.    She saw Audelia Acton for tick bite in June.  She ended up taking the Doxycycline, as she had a rash around the tick bite, and myalgias. The lump on the labia finally resolved in just the last couple of weeks.   She had high risk HPV noted on pap smear last year, though pap itself was normal. She is due for repeat this year.  No h/o abnormal paps in the past.  Immunization History  Administered Date(s) Administered  . Hepatitis B 02/20/1985, 04/22/1985, 04/20/1992  . Influenza Whole 06/12/2011, 06/08/2013  . Influenza,inj,Quad PF,6+ Mos 06/01/2015  . Influenza-Unspecified 06/26/2016, 06/19/2017  . PPD Test 12/22/2011, 01/07/2012, 08/22/2013  . Td 01/01/2004  . Tdap 12/22/2011   Got her flu shot at work Last Pap smear: 06/2017 normal, but +HR HPV detected Last mammogram: 04/2018 Last colonoscopy: 04/2011, Dr. Watt Climes, polyps (?hyperplastic)--told to f/u in 10 years  Last DEXA: normal through school--heel screen (10years ago)  Dentist: 4 times yearly  Ophtho: wears glasses, goes every 2 years (Dr. Kathrin Penner), went in April, 2018 per pt. Exercise: hour weight lifting 3x/week, and running 1-2 days/week (only more when training for a race), usually walks 3-6 miles (cardio 3x/week). Vitamin D screen: level of 31 06/2017 (level of 29 06/2016) Lipid screen: Lab Results  Component Value Date   CHOL 227 (H) 07/16/2017   HDL 86 07/16/2017   LDLCALC 124 (H) 07/16/2017   TRIG 78 07/16/2017   CHOLHDL 2.6 07/16/2017   Thinks she needs yearly labs for part of her insurance separate from the school).  Past Medical History:  Diagnosis Date  . H/O mumps   . H/O varicella   . Urethrocele, female    h/o normal cystoscopy (Dr. Serita Butcher)  . Vitamin D deficiency 2010   23 by Dr. Theda Sers, normal after taking supplements (recurrent in 2016)    Past Surgical History:   Procedure Laterality Date  . [place]   left--torn ligaments/acl  . MOHS SURGERY  2016, 09/2017   forehead, left temple (Dr. Sarajane Jews)  . TUBAL LIGATION    . WISDOM TOOTH EXTRACTION  1979    Social History   Socioeconomic History  . Marital status: Married    Spouse name: Not on file  . Number of children: 2  . Years of education: Not on file  . Highest education level: Not on file  Occupational History  . Occupation: Programmer, multimedia: [employer]  . Financial resource strain: Not on file  . Food insecurity:    Worry: Not on file    Inability: Not on file  . Transportation needs:    Medical: Not on file    Non-medical: Not on file  Tobacco Use  . Smoking status: Never Smoker  . Smokeless tobacco: Never Used  Substance and Sexual Activity  . Alcohol use: Not Currently    Comment: stopped drinking (fewer hot flashes without alcohol)  . Drug use: No  . Sexual activity: Yes    Partners: Male    Birth control/protection: Surgical    Comment: BTL  Lifestyle  . Physical activity:    Days per week: Not on file    Minutes per session: Not on file  . Stress: Not on file  Relationships  . Social connections:    Talks on  phone: Not on file    Gets together: Not on file    Attends religious service: Not on file    Active member of club or organization: Not on file    Attends meetings of clubs or organizations: Not on file    Relationship status: Not on file  . Intimate partner violence:    Fear of current or ex partner: Not on file    Emotionally abused: Not on file    Physically abused: Not on file    Forced sexual activity: Not on file  Other Topics Concern  . Not on file  Social History Narrative   Re-married 07/2015 (lives in [place]). Daughter is Engineer, maintenance (IT) in [place], son in [place] (works in [place]). No pets. RN at [employer].  Got degree in nursing informatics from [employer] History  Problem Relation Age of Onset  .  Hyperlipidemia Father   . Hypertension Father   . Immunodeficiency Mother        thrombocythemia  . Arthritis Mother        joint pains; on methotrexate, sees rheum; unspec connective tissue dz  . Sjogren's syndrome Mother   . Osteoporosis Mother   . Raynaud syndrome Mother   . Hypertension Mother   . Neuropathy Mother   . Depression Sister   . Anxiety disorder Sister   . Hypothyroidism Daughter   . Hypothyroidism Paternal Grandmother   . Diabetes Neg Hx   . Cancer Neg Hx     Outpatient Encounter Medications as of 07/22/2018  Medication Sig Note  . cholecalciferol (VITAMIN D) 1000 UNITS tablet Take 1,000 Units by mouth daily. 07/22/2018: Takes sporadically--if in the sun, doesn't take as much; causes some constipation  . [DISCONTINUED] doxycycline (VIBRA-TABS) 100 MG tablet Take 1 tablet (100 mg total) by mouth 2 (two) times daily.    No facility-administered encounter medications on file as of 07/22/2018.     No Known Allergies   ROS: The patient denies anorexia, fever, weight changes; denies headaches, vision changes, decreased hearing, ear pain, sore throat, breast concerns, chest pain, palpitations, dizziness, syncope, dyspnea on exertion, cough, swelling, nausea, vomiting, diarrhea, constipation, abdominal pain, melena, hematochezia, indigestion/heartburn, hematuria, incontinence, dysuria, vaginal bleeding, discharge, odor or itch, genital lesions, joint pains, numbness, tingling, weakness, tremor, suspicious skin lesions, depression, anxiety, abnormal bleeding/bruising, or enlarged lymph nodes. Sees derm (Dr. Wilhemina Bonito) yearly (in December) Hot flashes are tolerable.  Improved since cutting out alcohol. Short-lived sadness and palpitations just prior to a hot flash, less often.   PHYSICAL EXAM:  BP 128/78   Pulse (!) 54   Temp 98 F (36.7 C) (Oral)   Ht 5\' 8"  (1.727 m)   Wt 169 lb 12.8 oz (77 kg)   LMP 09/06/2011   SpO2 98%   BMI 25.82 kg/m   Wt Readings from  Last 3 Encounters:  07/22/18 169 lb 12.8 oz (77 kg)  03/09/18 172 lb 9.6 oz (78.3 kg)  02/10/18 172 lb 6.4 oz (78.2 kg)    General Appearance:  Alert, cooperative, no distress, appears stated age   Head:  Normocephalic, without obvious abnormality, atraumatic   Eyes:  PERRL, conjunctiva/corneas clear, EOM's intact, fundi benign   Ears:  Normal TM's and external ear canals   Nose:  Nares normal, mucosa normal, no drainage or sinus tenderness   Throat:  Lips, mucosa, and tongue normal; teeth and gums normal   Neck:  Supple, no lymphadenopathy; thyroid: no enlargement/tenderness/nodules; no carotid bruit  or JVD   Back:  Spine nontender, no curvature, ROM normal, no CVA tenderness   Lungs:  Clear to auscultation bilaterally without wheezes, rales or ronchi; respirations unlabored   Chest Wall:  No tenderness or deformity   Heart:  Regular rate and rhythm, S1 and S2 normal, no murmur, rub or gallop   Breast Exam:  No skin dimpling, nipple inversion, nipple discharge, breast mass or axillary lymphadenopathy  Abdomen:  Soft, non-tender, nondistended, normoactive bowel sounds, no masses, no hepatosplenomegaly   Genitalia:  Normal external genitalia. BUS and vagina normal. No cervical motion tenderness, cervical discharge or lesions. Uterus and adnexa are nontender, not enlarged no mass. Pap performed  Rectal:  Normal sphincter tone, no mass. Heme negative stool  Extremities:  No clubbing, cyanosis or edema   Pulses:  2+ and symmetric all extremities   Skin:  Skin color, texture, turgor normal, no rashes.   Lymph nodes:  Cervical, supraclavicular, and axillary nodes normal   Neurologic:  CNII-XII intact, normal strength, sensation and gait; reflexes 2+ and symmetric throughout      Psych:  Normal mood, affect, hygiene and grooming   ASSESSMENT/PLAN:  Annual physical exam - Plan: POCT Urinalysis DIP (Proadvantage Device), VITAMIN D 25 Hydroxy  (Vit-D Deficiency, Fractures), Lipid panel, Glucose, random, CBC with Differential/Platelet, Cytology - PAP([place])  High risk human papilloma virus (HPV) infection of cervix - noted last year, with normal pap.  Repeat today  Vitamin D deficiency - encouraged daily supplement - Plan: VITAMIN D 25 Hydroxy (Vit-D Deficiency, Fractures)   Discussed monthly self breast exams and yearly mammograms; at least 30 minutes of aerobic activity at least 5 days/week, weight-bearing exercise at least 2x/week; proper sunscreen use reviewed; healthy diet, including goals of calcium and vitamin D intake and alcohol recommendations (less than or equal to 1 drink/day) reviewed; regular seatbelt use; changing batteries in smoke detectors. Immunization recommendations discussed--UTD. Continue yearly flu shots. Shingrix recommended, risks/side effects reviewed.  Colonoscopy recommendations reviewed--UTD   F/u 1 year, sooner prn.

## 2018-07-22 ENCOUNTER — Encounter: Payer: Self-pay | Admitting: Family Medicine

## 2018-07-22 ENCOUNTER — Ambulatory Visit (INDEPENDENT_AMBULATORY_CARE_PROVIDER_SITE_OTHER): Payer: Managed Care, Other (non HMO) | Admitting: Family Medicine

## 2018-07-22 ENCOUNTER — Other Ambulatory Visit (HOSPITAL_COMMUNITY)
Admission: RE | Admit: 2018-07-22 | Discharge: 2018-07-22 | Disposition: A | Discharge status: Home / Self Care | Payer: Managed Care, Other (non HMO) | Source: Ambulatory Visit | Attending: Family Medicine | Admitting: Family Medicine

## 2018-07-22 VITALS — BP 128/78 | HR 54 | Temp 98.0°F | Ht 68.0 in | Wt 169.8 lb

## 2018-07-22 DIAGNOSIS — E559 Vitamin D deficiency, unspecified: Secondary | ICD-10-CM

## 2018-07-22 DIAGNOSIS — B977 Papillomavirus as the cause of diseases classified elsewhere: Secondary | ICD-10-CM

## 2018-07-22 DIAGNOSIS — Z0000 Encounter for general adult medical examination without abnormal findings: Secondary | ICD-10-CM

## 2018-07-22 DIAGNOSIS — N72 Inflammatory disease of cervix uteri: Secondary | ICD-10-CM | POA: Diagnosis not present

## 2018-07-22 LAB — POCT URINALYSIS DIP (PROADVANTAGE DEVICE)
Bilirubin, UA: NEGATIVE
Glucose, UA: NEGATIVE mg/dL
Ketones, POC UA: NEGATIVE mg/dL
LEUKOCYTES UA: NEGATIVE
NITRITE UA: NEGATIVE
Protein Ur, POC: NEGATIVE mg/dL
RBC UA: NEGATIVE
Specific Gravity, Urine: 1.025
UUROB: NEGATIVE
pH, UA: 6 (ref 5.0–8.0)

## 2018-07-23 LAB — CBC WITH DIFFERENTIAL/PLATELET
BASOS: 1 %
Basophils Absolute: 0.1 10*3/uL (ref 0.0–0.2)
EOS (ABSOLUTE): 0.1 10*3/uL (ref 0.0–0.4)
EOS: 2 %
Hematocrit: 42.3 % (ref 34.0–46.6)
Hemoglobin: 14 g/dL (ref 11.1–15.9)
IMMATURE GRANS (ABS): 0 10*3/uL (ref 0.0–0.1)
IMMATURE GRANULOCYTES: 0 %
Lymphocytes Absolute: 1.1 10*3/uL (ref 0.7–3.1)
Lymphs: 27 %
MCH: 28.6 pg (ref 26.6–33.0)
MCHC: 33.1 g/dL (ref 31.5–35.7)
MCV: 87 fL (ref 79–97)
MONOCYTES: 9 %
Monocytes Absolute: 0.3 10*3/uL (ref 0.1–0.9)
NEUTROS PCT: 61 %
Neutrophils Absolute: 2.4 10*3/uL (ref 1.4–7.0)
PLATELETS: 252 10*3/uL (ref 150–450)
RBC: 4.89 x10E6/uL (ref 3.77–5.28)
RDW: 11.9 % — ABNORMAL LOW (ref 12.3–15.4)
WBC: 3.9 10*3/uL (ref 3.4–10.8)

## 2018-07-23 LAB — LIPID PANEL
CHOLESTEROL TOTAL: 243 mg/dL — AB (ref 100–199)
Chol/HDL Ratio: 2.9 ratio (ref 0.0–4.4)
HDL: 83 mg/dL (ref >39)
LDL Calculated: 149 mg/dL — ABNORMAL HIGH (ref 0–99)
TRIGLYCERIDES: 55 mg/dL (ref 0–149)
VLDL CHOLESTEROL CAL: 11 mg/dL (ref 5–40)

## 2018-07-23 LAB — GLUCOSE, RANDOM: Glucose: 89 mg/dL (ref 65–99)

## 2018-07-23 LAB — VITAMIN D 25 HYDROXY (VIT D DEFICIENCY, FRACTURES): VIT D 25 HYDROXY: 26.9 ng/mL — AB (ref 30.0–100.0)

## 2018-07-27 LAB — CYTOLOGY - PAP
Diagnosis: NEGATIVE
HPV (WINDOPATH): NOT DETECTED

## 2018-09-29 ENCOUNTER — Telehealth: Payer: Self-pay | Admitting: Family Medicine

## 2018-09-29 NOTE — Telephone Encounter (Signed)
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 She was wondering if your husband or if you know anyone that would be willing to work with her. Emil can be reached at [phone]

## 2018-10-05 ENCOUNTER — Encounter: Payer: Self-pay | Admitting: Family Medicine

## 2018-10-05 NOTE — Telephone Encounter (Signed)
MyChart message sent to pt with contact info to answer her question

## 2018-11-23 ENCOUNTER — Ambulatory Visit: Payer: Managed Care, Other (non HMO) | Admitting: Medical

## 2018-11-23 ENCOUNTER — Encounter: Payer: Self-pay | Admitting: Medical

## 2018-11-23 VITALS — BP 118/76 | HR 56 | Temp 98.3°F | Resp 16 | Wt 172.2 lb

## 2018-11-23 DIAGNOSIS — R6889 Other general symptoms and signs: Secondary | ICD-10-CM

## 2018-11-23 DIAGNOSIS — R05 Cough: Secondary | ICD-10-CM | POA: Diagnosis not present

## 2018-11-23 DIAGNOSIS — R509 Fever, unspecified: Secondary | ICD-10-CM | POA: Diagnosis not present

## 2018-11-23 DIAGNOSIS — R059 Cough, unspecified: Secondary | ICD-10-CM

## 2018-11-23 LAB — CBC WITH DIFFERENTIAL/PLATELET
BASOS: 1 %
Basophils Absolute: 0.1 10*3/uL (ref 0.0–0.2)
EOS (ABSOLUTE): 0.2 10*3/uL (ref 0.0–0.4)
EOS: 2 %
HEMATOCRIT: 40.1 % (ref 34.0–46.6)
Hemoglobin: 13.6 g/dL (ref 11.1–15.9)
Immature Grans (Abs): 0 10*3/uL (ref 0.0–0.1)
Immature Granulocytes: 0 %
LYMPHS ABS: 1.4 10*3/uL (ref 0.7–3.1)
LYMPHS: 15 %
MCH: 29.8 pg (ref 26.6–33.0)
MCHC: 33.9 g/dL (ref 31.5–35.7)
MCV: 88 fL (ref 79–97)
MONOCYTES: 7 %
MONOS ABS: 0.7 10*3/uL (ref 0.1–0.9)
NEUTROS PCT: 75 %
Neutrophils Absolute: 7.4 10*3/uL — ABNORMAL HIGH (ref 1.4–7.0)
Platelets: 290 10*3/uL (ref 150–450)
RBC: 4.56 x10E6/uL (ref 3.77–5.28)
RDW: 11.6 % — ABNORMAL LOW (ref 11.7–15.4)
WBC: 9.8 10*3/uL (ref 3.4–10.8)

## 2018-11-23 LAB — POCT INFLUENZA A/B
INFLUENZA B, POC: NEGATIVE
Influenza A, POC: NEGATIVE

## 2018-11-23 NOTE — Progress Notes (Signed)
Subjective:     Patient ID: [place], female   DOB: May 08, 1961, 58 y.o.   MRN: [id number]  HPI Chief Complaint  Patient presents with  . cough    fever, cough, drainage, headache, congestion X 2 -23-20    On 11/14/18 started feeling sick with feverish feelings and achy while in yoga class.   Within 2 days, felt worse.   Tried to go back to work yesterday, but felt feverish again last night.   She reports cough, head congestion, blowing nose, fatigue, body aches, fever, chills.  Fever was subjective.  Using advil all week.   symptoms present 9 days.    At this point felt worse than this past week.  Yesterday came in from work, cried and went into bed feeling so bad.  Has continued ibuprofen ongoing but no other relieving factors.   Hydration is good.  No nausea, no vomiting, no diarrhea.   No specific flu contacts.   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  Works at [employer], and there are [place] students there.   She notes one student went home to [place] 10/12/18, and when she returned early February, they advised that student stay home and she did stay home 18 days to prevent any potential exposure.   No other [place] students have recently returned from [place].   No personal recent travel.  No other aggravating or relieving factors. No other complaint.  Past Medical History:  Diagnosis Date  . H/O mumps   . H/O varicella   . Urethrocele, female    h/o normal cystoscopy (Dr. Serita Butcher)  . Vitamin D deficiency 2010   23 by Dr. Theda Sers, normal after taking supplements (recurrent in 2016)   Current Outpatient Medications on File Prior to Visit  Medication Sig Dispense Refill  . cholecalciferol (VITAMIN D) 1000 UNITS tablet Take 1,000 Units by mouth daily.     No current facility-administered medications on file prior to visit.     Review of Systems As in subjective     Objective:   Physical Exam BP 118/76   Pulse (!) 56   Temp 98.3 F (36.8  C) (Oral)   Resp 16   Wt 172 lb 3.2 oz (78.1 kg)   LMP 09/06/2011   SpO2 98%   BMI 26.18 kg/m   Wt Readings from Last 3 Encounters:  11/23/18 172 lb 3.2 oz (78.1 kg)  07/22/18 169 lb 12.8 oz (77 kg)  03/09/18 172 lb 9.6 oz (78.3 kg)   General: somewhat Ill-appearing, well-developed, well-nourished Skin: warm, dry HEENT: Nose inflamed and congested, clear conjunctiva, TMs pearly, no sinus tenderness, pharynx with erythema, no exudates Neck: Supple, non tender, shotty cervical adenopathy Heart: Regular rate and rhythm, normal S1, S2, no murmurs Lungs: Clear to auscultation bilaterally, no wheezes, rales, rhonchi Extremities: Mild generalized tenderness      Assessment:     Encounter Diagnoses  Name Primary?  . Cough Yes  . Fever, unspecified fever cause   . Flu-like symptoms        Plan:      Flu test negative  Discussed symptoms, exam findings.  CBC today.  Etiology likely flu.   We discussed Coronavirus, but based on her contacts, doesn't sound like she has obvious exposure. Discussed supportive care including rest, hydration, OTC Tylenol or NSAID for fever, aches, and malaise.  Discussed period of contagion, self quarantine at home away from others to avoid spread of  disease, discussed means of transmission, and possible complications including pneumonia.  If worse or not improving within the next 4-5 days, then call or return.  Patient voiced understanding of diagnosis, recommendations, and treatment plan.  After visit summary given.  Gave note for work.  Drea was seen today for cough.  Diagnoses and all orders for this visit:  Cough -     Influenza A/B -     CBC with Differential/Platelet  Fever, unspecified fever cause -     Influenza A/B -     CBC with Differential/Platelet  Flu-like symptoms -     CBC with Differential/Platelet

## 2018-11-23 NOTE — Patient Instructions (Signed)
Thank you for giving me the opportunity to serve you today.   Your diagnosis today includes: Encounter Diagnoses  Name Primary?  . Cough Yes  . Fever, unspecified fever cause   . Flu-like symptoms     Specific home care recommendations today include:  Only take over-the-counter (OTC) or prescription medicines for pain, discomfort, or fever as directed by your caregiver.    Decongestant: You may use OTC Guaifenesin (Mucinex plain) for congestion.  You may use Pseudoephedrine (Sudafed) only if you don't have blood pressure problems or a diagnosis of hypertension.  Cough suppression: If you have cough from drainage, you may use over-the-counter Dextromethorphan (Delsym) as directed on the label  Sore throat remedies:  You may use salt water gargles, warm fluids such as coffee or hot tea, or honey/tea/lemon mixture to sooth sore throat pain.  You may use OTC sore throat remedies such as Cepacol lozenges or Chloraseptic spray for sore throat pain.  Runny nose and sneezing remedies: You may use OTC antihistamine such as Zyrtec or Benadryl, but caution as these can cause drowsiness.    Pain/fever relief: You may use over-the-counter Tylenol for pain or fever  Drink extra fluids. Fluids help thin the mucus so your sinuses can drain more easily.   Applying either moist heat or ice packs to the sinus areas may help relieve discomfort.  Use saline nasal sprays to help moisten your sinuses. The sprays can be found at your local drugstore.    Please call or return if worse or not improving in the next few days.      I have included other useful information below for your review.   Influenza, Adult Influenza ("the flu") is a viral infection of the respiratory tract. It causes chills, fever, cough, headache, body aches, and sore throat. Influenza in general will make you feel sicker than when you have a common cold. Symptoms of the illness typically last a few days. Cough and fatigue may  continue for as long as 7 to 10 days. Influenza is highly contagious. It spreads easily to others in the droplets from coughs and sneezes. People frequently become infected by touching something that was recently contaminated with the virus and then touch their mouth, nose or eyes. This infection is caused by a virus. Symptoms will not be reduced or improved by taking an antibiotic. Antibiotics are medications that kill bacteria, not viruses. DIAGNOSIS  Diagnosis of influenza is often made based on the history and physical examination as well as the presence of influenza reports occurring in your community. Testing can be done if the diagnosis is not certain. TREATMENT  Since influenza is caused by a virus, antibiotics are not helpful. Your caregiver may prescribe antiviral medicines to shorten the illness and lessen the severity. Your caregiver may also recommend influenza vaccination and/or antiviral medicines for your family members in order to prevent the spread of influenza to them. HOME CARE INSTRUCTIONS  DO NOT GIVE ASPIRIN TO PERSONS WITH INFLUENZA WHO ARE UNDER 18 YEARS OF AGE. This could lead to brain and liver damage (Reye's syndrome). Read the label on over-the-counter medicines.   Stay home from work or school if at all possible until most of your symptoms are gone.   Only take over-the-counter or prescription medicines for pain, discomfort, or fever as directed by your caregiver.   Use a cool mist humidifier to increase air moisture. This will make breathing easier.   Rest until your temperature is nearly normal: 98.6 F (37  C). This usually takes 3 to 4 days. Be sure you get plenty of rest.   Drink at least eight, eight-ounce glasses of fluids per day. Fluids include water, juice, broth, gelatin, or lemonade.   Cover your mouth and nose when coughing or sneezing and wash your hands often to prevent the spread of this virus to other persons.  PREVENTION  Annual influenza  vaccination (flu shots) is the best way to avoid getting influenza. An annual flu shot is now routinely recommended for all adults in the [place] IF:   You develop shortness of breath while resting.   You have a deep cough with production of mucous or chest pain.   You develop nausea (feeling sick to your stomach), vomiting, or diarrhea.  SEEK IMMEDIATE MEDICAL CARE IF:   You have difficulty breathing, become short of breath, or your skin or nails turn bluish.   You develop severe neck pain or stiffness.   You develop a severe headache, facial pain, or earache.   You have a fever.   You develop nausea or vomiting that cannot be controlled.  Document Released: 09/05/2000 Document Revised: 05/21/2011 Document Reviewed: 07/11/2009 [facility] Patient Information 2012 [place].

## 2018-11-29 ENCOUNTER — Other Ambulatory Visit: Payer: Self-pay | Admitting: Medical

## 2018-11-29 DIAGNOSIS — R509 Fever, unspecified: Secondary | ICD-10-CM

## 2018-11-29 DIAGNOSIS — R059 Cough, unspecified: Secondary | ICD-10-CM

## 2018-11-29 DIAGNOSIS — R05 Cough: Secondary | ICD-10-CM

## 2018-12-03 ENCOUNTER — Telehealth: Payer: Self-pay | Admitting: Medical

## 2018-12-03 NOTE — Telephone Encounter (Signed)
Call and check on how she is feeling at this point?

## 2018-12-08 ENCOUNTER — Other Ambulatory Visit: Payer: Self-pay | Admitting: Medical

## 2018-12-08 MED ORDER — ALBUTEROL SULFATE HFA 108 (90 BASE) MCG/ACT IN AERS: 2.0000 | INHALATION_SPRAY | Freq: Four times a day (QID) | RESPIRATORY_TRACT | 0 refills | Status: DC | PRN

## 2018-12-08 NOTE — Telephone Encounter (Signed)
Patient states that she is filling better.  She did not go for chest xray she didn't think she needed it.   She states that she may need an inhaler.

## 2018-12-08 NOTE — Telephone Encounter (Signed)
See msg

## 2018-12-13 NOTE — Telephone Encounter (Signed)
Hopefully she got a prompt from pharmacy last week, but I did send inhaler last week

## 2018-12-13 NOTE — Telephone Encounter (Signed)
Patient did get her inhaler

## 2019-04-07 ENCOUNTER — Other Ambulatory Visit: Payer: Self-pay | Admitting: Family Medicine

## 2019-04-07 DIAGNOSIS — Z1231 Encounter for screening mammogram for malignant neoplasm of breast: Secondary | ICD-10-CM

## 2019-05-26 ENCOUNTER — Other Ambulatory Visit: Payer: Self-pay

## 2019-05-26 ENCOUNTER — Ambulatory Visit
Admission: RE | Admit: 2019-05-26 | Discharge: 2019-05-26 | Disposition: A | Discharge status: Home / Self Care | Payer: Managed Care, Other (non HMO) | Source: Ambulatory Visit | Attending: Family Medicine | Admitting: Family Medicine

## 2019-05-26 DIAGNOSIS — Z1231 Encounter for screening mammogram for malignant neoplasm of breast: Secondary | ICD-10-CM

## 2019-06-29 ENCOUNTER — Encounter: Payer: Self-pay | Admitting: Family Medicine

## 2019-06-29 NOTE — Progress Notes (Signed)
Start time: 9:39 End time: 9:58  Virtual Visit via Video Note  I connected with [place] on 06/29/19 at  9:30 AM EDT by a video enabled telemedicine application and verified that I am speaking with the correct person using two identifiers.  Location: Patient: work Provider: office   I discussed the limitations of evaluation and management by telemedicine and the availability of in person appointments. The patient expressed understanding and agreed to proceed.  History of Present Illness:  Chief Complaint  Patient presents with  . Acute Visit    possible shingles, left side of back-mid thoracic area. Same size as yesterday (pic). Blisters have gotten somewhat bigger. Redness same.    2 days ago, 10/6, while sitting at desk she suddenly noticed a burning sensation on the right side of her back.  Then she noticed redness, almost like an insect bite ("could see a center"). 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 She sent pictures yesterday (see [place]), feels like there was some increased vesicles in the center and a little more red today.  Denies any other rashes--nothing on abdomen, or any other areas on back or other parts of body.  It remains itchy, as well as painful.    PMH, PSH, SH reviewed  Current Outpatient Medications on File Prior to Visit  Medication Sig Dispense Refill  . cholecalciferol (VITAMIN D) 1000 UNITS tablet Take 1,000 Units by mouth daily.    Marland Kitchen albuterol (PROVENTIL HFA;VENTOLIN HFA) 108 (90 Base) MCG/ACT inhaler Inhale 2 puffs into the lungs every 6 (six) hours as needed for wheezing or shortness of breath. (Patient not taking: Reported on 06/30/2019) 1 Inhaler 0   No current facility-administered medications on file prior to visit.    No Known Allergies  ROS: no fever, chills myalgias, other rashes, URI symptoms, headaches, dizziness, cough, shortness of breath or other complaints.  See HPI.      Observations/Objective:  BP 120/90   Pulse 80   Temp 97.9 F (36.6 C) (Other (Comment))   Ht 5\' 8"  (1.727 m)   Wt 170 lb (77.1 kg)   LMP 09/06/2011   SpO2 99%   BMI 25.85 kg/m   Well appearing, pleasant female, in no distress. Skin:  Right side of her back, below bra line, there is an area of erythema that is wider/larger than on photo sent yesterday. There remains a cluster of vesicles in the center, no other areas of vesicles noted. Denies any rash on abdomen.   Assessment and Plan:  Cellulitis of back except buttock - suspect insect bite with developing cellulitis (given the spread of redness, and itching) - Plan: doxycycline (VIBRA-TABS) 100 MG tablet  Herpes zoster without complication - doubt shingles, included in differential. To start Valtrex if further vesicles are noted within the expanding erythema, vs new lesions in dermatome - Plan: valACYclovir (VALTREX) 1000 MG tablet   Doxycycline risks/SE reviewed Advised to mark the edges of erythema to better be able to tell if worsening vs improving. HC prn for itching Antihistamine po also to help with itching/inflammation.  Discussed Ddx includes shingles, so prescription for Valtrex sent (she prefers not to start immediately to cover, but to wait--encouraged to start within 72 hours if not clearly improving from ABX).   Follow Up Instructions:    I discussed the assessment and treatment plan with the patient. The patient was provided an opportunity to ask questions and all were answered. The patient agreed with  the plan and demonstrated an understanding of the instructions.   The patient was advised to call back or seek an in-person evaluation if the symptoms worsen or if the condition fails to improve as anticipated.  I provided 19 minutes of non-face-to-face time during this encounter.   Vikki Ports, MD

## 2019-06-30 ENCOUNTER — Ambulatory Visit: Payer: Managed Care, Other (non HMO) | Admitting: Family Medicine

## 2019-06-30 ENCOUNTER — Other Ambulatory Visit: Payer: Self-pay

## 2019-06-30 ENCOUNTER — Encounter: Payer: Self-pay | Admitting: Family Medicine

## 2019-06-30 VITALS — BP 120/90 | HR 80 | Temp 97.9°F | Ht 68.0 in | Wt 170.0 lb

## 2019-06-30 DIAGNOSIS — L03312 Cellulitis of back [any part except buttock]: Secondary | ICD-10-CM | POA: Diagnosis not present

## 2019-06-30 DIAGNOSIS — B029 Zoster without complications: Secondary | ICD-10-CM

## 2019-06-30 MED ORDER — VALACYCLOVIR HCL 1 G PO TABS: 1000.0000 mg | ORAL_TABLET | Freq: Three times a day (TID) | ORAL | 0 refills | Status: DC

## 2019-06-30 MED ORDER — DOXYCYCLINE HYCLATE 100 MG PO TABS: 100.0000 mg | ORAL_TABLET | Freq: Two times a day (BID) | ORAL | 0 refills | Status: DC

## 2019-06-30 NOTE — Patient Instructions (Signed)
I suspect that you had some sort of insect bite/sting, with a developing cellulitis.  The antibiotic should treat the skin infection.  Mark the edges of the red area with pen so that we can clearly see it receding as it responds to the antibiotic.  If it continues to expand, then you will need further evaluation/treatment.  Given the history (no pain prior to onset of rash, pain not severe, and itching is significant), it seems more likely insect rather than shingles. However, the red base with blisters could still definitely be shingles, especially if you notice any other blisters develop (within the area of redness, or separate areas of redness with blisters spreading either closer to your spine, or around the side to your stomach). Start the Valtrex if you do notice these changes, to treat for shingles, ideally started within 72 hours of the rash.  Use antihistamines (ie claritin/allegra/zyrtec) to help with itching and inflammation.  You can also try some cool compresses to help with pain. Use tylenol and/or ibuprofen as needed for pain.  Feel free to send me updated photos and reach out with any concerns.

## 2019-07-23 NOTE — Progress Notes (Signed)
Chief Complaint  Patient presents with  . Annual Exam    fasting annual with gyn exam. Sees eyes doctor for annual exam. No new concerns.     Annette Marks is a 58 y.o. female who presents for a complete physical.  She has the following concerns:  She was last seen virtually with cellulitis vs shingles on her back.  She was treated with doxycycline (and given Valtrex also). It cleared up after 2 days on the ABX, never needed Valtrex.  Vitamin D deficiency:  Level was low at 26.9 last year.  She had reported taking the vitamin D only sporadically (not taking it if she got any sun), as it caused some constipation.  She is currently taking 2000 IU 4-5 times/week.  H/o +HPV on pap smear 2 years ago (with normal pap).  Repeat last year was normal no high risk HPV present.  Mild hyperlipidemia:  It was noted that her LDL cholesterol was higher last year than in prior years.  HDL remained good. She eats mostly chicken and fish, rarely eats beef.  Cut back on cheese (occasional hard cheese, eats greek yogurt).  No longer eating eggs  Lab Results  Component Value Date   CHOL 243 (H) 07/22/2018   HDL 83 07/22/2018   LDLCALC 149 (H) 07/22/2018   TRIG 55 07/22/2018   CHOLHDL 2.9 07/22/2018     Immunization History  Administered Date(s) Administered  . Hepatitis B 02/20/1985, 04/22/1985, 04/20/1992  . Influenza Whole 06/12/2011, 06/08/2013  . Influenza,inj,Quad PF,6+ Mos 06/01/2015  . Influenza-Unspecified 06/26/2016, 06/19/2017, 06/08/2018  . PPD Test 12/22/2011, 01/07/2012, 08/22/2013  . Td 01/01/2004  . Tdap 12/22/2011   She gets flu shots at work Last Pap smear: 06/2018, normal, no high risk HPV (did have HPV on 2018 pap) Last mammogram: 05/2019 Last colonoscopy: 04/2011, Dr. Watt Climes, polyps (?hyperplastic)--told to f/u in 10 years  Last DEXA: normal through school--heel screen (over 10years ago)  Dentist:4times yearly  Ophtho: wears glasses, goes yearly (Dr.  Kathrin Penner) Exercise: Running once a week (shorter distances since her illness in March, 1-2 miles), walks 4 miles 5x/week. Yoga 3x/week.  No other weight-bearing exercise. Pre-COVID routine was hour weight lifting class 3x/week, which she hasn't resumed yet.  Past Medical History:  Diagnosis Date  . H/O mumps   . H/O varicella   . Urethrocele, female    h/o normal cystoscopy (Dr. Serita Butcher)  . Vitamin D deficiency 2010   23 by Dr. Theda Sers, normal after taking supplements (recurrent in 2016)    Past Surgical History:  Procedure Laterality Date  . [place]   left--torn ligaments/acl  . MOHS SURGERY  2016, 09/2017   forehead, left temple (Dr. Sarajane Jews)  . TUBAL LIGATION    . WISDOM TOOTH EXTRACTION  1979    Social History   Socioeconomic History  . Marital status: Married    Spouse name: Not on file  . Number of children: 2  . Years of education: Not on file  . Highest education level: Not on file  Occupational History  . Occupation: Programmer, multimedia: [employer]  . Financial resource strain: Not on file  . Food insecurity    Worry: Not on file    Inability: Not on file  . Transportation needs    Medical: Not on file    Non-medical: Not on file  Tobacco Use  . Smoking status: Never Smoker  . Smokeless tobacco: Never Used  Substance and Sexual Activity  . Alcohol use: Not Currently    Comment: stopped drinking (fewer hot flashes without alcohol)  . Drug use: No  . Sexual activity: Yes    Partners: Male    Birth control/protection: Surgical    Comment: BTL  Lifestyle  . Physical activity    Days per week: Not on file    Minutes per session: Not on file  . Stress: Not on file  Relationships  . Social Herbalist on phone: Not on file    Gets together: Not on file    Attends religious service: Not on file    Active member of club or organization: Not on file    Attends meetings of clubs or organizations: Not on  file    Relationship status: Not on file  . Intimate partner violence    Fear of current or ex partner: Not on file    Emotionally abused: Not on file    Physically abused: Not on file    Forced sexual activity: Not on file  Other Topics Concern  . Not on file  Social History Narrative   Re-married 07/2015 (lives in [place]). Daughter is Engineer, maintenance (IT) in [place], son in [place] (works in [place]). No pets. RN at [employer].  Got degree in nursing informatics from [employer] History  Problem Relation Age of Onset  . Hyperlipidemia Father   . Hypertension Father   . Congestive Heart Failure Father   . Immunodeficiency Mother        thrombocythemia  . Arthritis Mother        joint pains; on methotrexate, sees rheum; unspec connective tissue dz  . Sjogren's syndrome Mother   . Osteoporosis Mother   . Raynaud syndrome Mother   . Hypertension Mother   . Neuropathy Mother   . Depression Sister   . Anxiety disorder Sister   . Hypothyroidism Daughter   . Hypothyroidism Paternal Grandmother   . Diabetes Neg Hx   . Cancer Neg Hx     Outpatient Encounter Medications as of 07/25/2019  Medication Sig Note  . cholecalciferol (VITAMIN D) 1000 UNITS tablet Take 2,000 Units by mouth daily.  06/30/2019: Taking 4-5 times/week  . [DISCONTINUED] albuterol (PROVENTIL HFA;VENTOLIN HFA) 108 (90 Base) MCG/ACT inhaler Inhale 2 puffs into the lungs every 6 (six) hours as needed for wheezing or shortness of breath. (Patient not taking: Reported on 06/30/2019)   . [DISCONTINUED] doxycycline (VIBRA-TABS) 100 MG tablet Take 1 tablet (100 mg total) by mouth 2 (two) times daily.   . [DISCONTINUED] valACYclovir (VALTREX) 1000 MG tablet Take 1 tablet (1,000 mg total) by mouth 3 (three) times daily.    No facility-administered encounter medications on file as of 07/25/2019.     No Known Allergies  ROS: The patient denies anorexia, fever, weight changes; denies headaches, vision changes, decreased hearing, ear  pain, sore throat, breast concerns, chest pain, palpitations, dizziness, syncope, dyspnea on exertion, cough, swelling, nausea, vomiting, diarrhea, constipation, abdominal pain, melena, hematochezia, indigestion/heartburn, hematuria, incontinence, dysuria, vaginal bleeding, discharge, odor or itch, genital lesions, joint pains, numbness, tingling, weakness, tremor, suspicious skin lesions, depression, anxiety, abnormal bleeding/bruising, or enlarged lymph nodes. Sees derm (Dr. Wilhemina Bonito) yearly. Hot flashes are tolerable.Improved since cutting out alcohol. Short-lived sadness/anxiety and palpitations just prior to a hot flash, less often.   PHYSICAL EXAM:  BP 110/70   Pulse 60   Temp (!) 97.1 F (36.2 C) (Other (Comment))  Ht '5\' 8"'$  (1.727 m)   Wt 167 lb 3.2 oz (75.8 kg)   LMP 09/06/2011   BMI 25.42 kg/m   Wt Readings from Last 3 Encounters:  07/25/19 167 lb 3.2 oz (75.8 kg)  06/30/19 170 lb (77.1 kg)  11/23/18 172 lb 3.2 oz (78.1 kg)    General Appearance:  Alert, cooperative, no distress, appears stated age   Head:  Normocephalic, without obvious abnormality, atraumatic   Eyes:  PERRL, conjunctiva/corneas clear, EOM's intact, fundi benign   Ears:  Normal TM's and external ear canals   Nose:  Not examined, wearing mask due to COVID-19 pandemic  Throat:  Not examined, wearing mask due to COVID-19 pandemic  Neck:  Supple, no lymphadenopathy; thyroid: no enlargement/tenderness/nodules; no carotid bruit or JVD   Back:  Spine nontender, no curvature, ROM normal, no CVA tenderness   Lungs:  Clear to auscultation bilaterally without wheezes, rales or ronchi; respirations unlabored   Chest Wall:  No tenderness or deformity   Heart:  Regular rate and rhythm, S1 and S2 normal, no murmur, rub or gallop   Breast Exam:  No skin dimpling, nipple inversion, nipple discharge, breast mass or axillary lymphadenopathy  Abdomen:  Soft, non-tender, nondistended, normoactive  bowel sounds, no masses, no hepatosplenomegaly   Genitalia:  Normal external genitalia. BUS and vagina normal. No cervical motion tenderness. Uterus and adnexa are nontender, not enlarged no mass. Pap not performed  Rectal:  Normal sphincter tone, no mass. Heme negative stool  Extremities:  No clubbing, cyanosis or edema   Pulses:  2+ and symmetric all extremities   Skin:  Skin color, texture, turgor normal, no rashes.   Lymph nodes:  Cervical, supraclavicular, and axillary nodes normal   Neurologic:  Normal strength, sensation and gait; reflexes 2+ and symmetric throughout                 Psych:  Normal mood, affect, hygiene and grooming   ASSESSMENT/PLAN:  Annual physical exam - Plan: VITAMIN D 25 Hydroxy (Vit-D Deficiency, Fractures), CBC with Differential/Platelet, Comprehensive metabolic panel, Lipid panel, SAR CoV2 Serology (COVID 19)AB(IGG)IA  Vitamin D deficiency - due for recheck - Plan: VITAMIN D 25 Hydroxy (Vit-D Deficiency, Fractures)  Pure hypercholesterolemia - reviewed low cholesterol diet.  Recheck today - Plan: Lipid panel  History of viral illness - Plan: SAR CoV2 Serology (COVID 19)AB(IGG)IA   Vit D, lipids, c-met CBC normal in March. Wants checked again due to mom's platelet disorder. COVID Ab's (illness in March)  Discussed monthly self breast exams and yearly mammograms; at least 30 minutes of aerobic activity at least 5 days/week, weight-bearing exercise at least 2x/week; proper sunscreen use reviewed; healthy diet, including goals of calcium and vitamin D intake and alcohol recommendations (less than or equal to 1 drink/day) reviewed; regular seatbelt use; changing batteries in smoke detectors. Immunization recommendations discussed--UTD. Continue yearly flu shots. Shingrix recommended, risks/side effects reviewed. Colonoscopy recommendations reviewed--UTD   F/u 1 year, sooner prn.

## 2019-07-23 NOTE — Patient Instructions (Signed)
HEALTH MAINTENANCE RECOMMENDATIONS:  It is recommended that you get at least 30 minutes of aerobic exercise at least 5 days/week (for weight loss, you may need as much as 60-90 minutes). This can be any activity that gets your heart rate up. This can be divided in 10-15 minute intervals if needed, but try and build up your endurance at least once a week.  Weight bearing exercise is also recommended twice weekly.  Eat a healthy diet with lots of vegetables, fruits and fiber.  "Colorful" foods have a lot of vitamins (ie green vegetables, tomatoes, red peppers, etc).  Limit sweet tea, regular sodas and alcoholic beverages, all of which has a lot of calories and sugar.  Up to 1 alcoholic drink daily may be beneficial for women (unless trying to lose weight, watch sugars).  Drink a lot of water.  Calcium recommendations are 1200-1500 mg daily (1500 mg for postmenopausal women or women without ovaries), and vitamin D 1000 IU daily.  This should be obtained from diet and/or supplements (vitamins), and calcium should not be taken all at once, but in divided doses.  Monthly self breast exams and yearly mammograms for women over the age of 59 is recommended.  Sunscreen of at least SPF 30 should be used on all sun-exposed parts of the skin when outside between the hours of 10 am and 4 pm (not just when at beach or pool, but even with exercise, golf, tennis, and yard work!)  Use a sunscreen that says "broad spectrum" so it covers both UVA and UVB rays, and make sure to reapply every 1-2 hours.  Remember to change the batteries in your smoke detectors when changing your clock times in the spring and fall. Carbon monoxide detectors are recommended for your home.  Use your seat belt every time you are in a car, and please drive safely and not be distracted with cell phones and texting while driving.  I recommend getting the new shingles vaccine (Shingrix). You will need to check with your insurance to see if it  is covered, and if covered, schedule nurse visit at our office when convenient.  It is a series of 2 injections, spaced 2 months apart. (if you elect to get this at the pharmacy instead, be sure to ask when you check insurance to see if the coverage is different, in office vs pharmacy).    Fat and Cholesterol Restricted Eating Plan Getting too much fat and cholesterol in your diet may cause health problems. Choosing the right foods helps keep your fat and cholesterol at normal levels. This can keep you from getting certain diseases. What are tips for following this plan? Meal planning  At meals, divide your plate into four equal parts: ? Fill one-half of your plate with vegetables and green salads. ? Fill one-fourth of your plate with whole grains. ? Fill one-fourth of your plate with low-fat (lean) protein foods.  Eat fish that is high in omega-3 fats at least two times a week. This includes mackerel, tuna, sardines, and salmon.  Eat foods that are high in fiber, such as whole grains, beans, apples, broccoli, carrots, peas, and barley.   General tips   Work with your doctor to lose weight if you need to.  Avoid: ? Foods with added sugar. ? Fried foods. ? Foods with partially hydrogenated oils.  Limit alcohol intake to no more than 1 drink a day for nonpregnant women and 2 drinks a day for men. One drink equals 12 oz  of beer, 5 oz of wine, or 1 oz of hard liquor. Reading food labels  Check food labels for: ? Trans fats. ? Partially hydrogenated oils. ? Saturated fat (g) in each serving. ? Cholesterol (mg) in each serving. ? Fiber (g) in each serving.  Choose foods with healthy fats, such as: ? Monounsaturated fats. ? Polyunsaturated fats. ? Omega-3 fats.  Choose grain products that have whole grains. Look for the word "whole" as the first word in the ingredient list. Cooking  Cook foods using low-fat methods. These include baking, boiling, grilling, and broiling.   Eat more home-cooked foods. Eat at restaurants and buffets less often.  Avoid cooking using saturated fats, such as butter, cream, palm oil, palm kernel oil, and coconut oil.   Recommended foods  Fruits  All fresh, canned (in natural juice), or frozen fruits. Vegetables  Fresh or frozen vegetables (raw, steamed, roasted, or grilled). Green salads. Grains  Whole grains, such as whole wheat or whole grain breads, crackers, cereals, and pasta. Unsweetened oatmeal, bulgur, barley, quinoa, or brown rice. Corn or whole wheat flour tortillas. Meats and other protein foods  Ground beef (85% or leaner), grass-fed beef, or beef trimmed of fat. Skinless chicken or [place]. Ground chicken or [place]. Pork trimmed of fat. All fish and seafood. Egg whites. Dried beans, peas, or lentils. Unsalted nuts or seeds. Unsalted canned beans. Nut butters without added sugar or oil. Dairy  Low-fat or nonfat dairy products, such as skim or 1% milk, 2% or reduced-fat cheeses, low-fat and fat-free ricotta or cottage cheese, or plain low-fat and nonfat yogurt. Fats and oils  Tub margarine without trans fats. Light or reduced-fat mayonnaise and salad dressings. Avocado. Olive, canola, sesame, or safflower oils. The items listed above may not be a complete list of foods and beverages you can eat. Contact a dietitian for more information.  Foods to avoid Fruits  Canned fruit in heavy syrup. Fruit in cream or butter sauce. Fried fruit. Vegetables  Vegetables cooked in cheese, cream, or butter sauce. Fried vegetables. Grains  White bread. White pasta. White rice. Cornbread. Bagels, pastries, and croissants. Crackers and snack foods that contain trans fat and hydrogenated oils. Meats and other protein foods  Fatty cuts of meat. Ribs, chicken wings, bacon, sausage, bologna, salami, chitterlings, fatback, hot dogs, bratwurst, and packaged lunch meats. Liver and organ meats. Whole eggs and egg yolks. Chicken and  [place] with skin. Fried meat. Dairy  Whole or 2% milk, cream, half-and-half, and cream cheese. Whole milk cheeses. Whole-fat or sweetened yogurt. Full-fat cheeses. Nondairy creamers and whipped toppings. Processed cheese, cheese spreads, and cheese curds. Beverages  Alcohol. Sugar-sweetened drinks such as sodas, lemonade, and fruit drinks. Fats and oils  Butter, stick margarine, lard, shortening, ghee, or bacon fat. Coconut, palm kernel, and palm oils. Sweets and desserts  Corn syrup, sugars, honey, and molasses. Candy. Jam and jelly. Syrup. Sweetened cereals. Cookies, pies, cakes, donuts, muffins, and ice cream. The items listed above may not be a complete list of foods and beverages you should avoid. Contact a dietitian for more information. Summary  Choosing the right foods helps keep your fat and cholesterol at normal levels. This can keep you from getting certain diseases.  At meals, fill one-half of your plate with vegetables and green salads.  Eat high-fiber foods, like whole grains, beans, apples, carrots, peas, and barley.  Limit added sugar, saturated fats, alcohol, and fried foods. This information is not intended to replace advice given to you by your  health care provider. Make sure you discuss any questions you have with your health care provider. Document Released: 03/09/2012 Document Revised: 05/12/2018 Document Reviewed: 05/26/2017 Elsevier Patient Education  2020 [employer].

## 2019-07-25 ENCOUNTER — Encounter: Payer: Self-pay | Admitting: Family Medicine

## 2019-07-25 ENCOUNTER — Ambulatory Visit (INDEPENDENT_AMBULATORY_CARE_PROVIDER_SITE_OTHER): Payer: Managed Care, Other (non HMO) | Admitting: Family Medicine

## 2019-07-25 ENCOUNTER — Other Ambulatory Visit: Payer: Self-pay

## 2019-07-25 VITALS — BP 110/70 | HR 60 | Temp 97.1°F | Ht 68.0 in | Wt 167.2 lb

## 2019-07-25 DIAGNOSIS — E7800 Pure hypercholesterolemia, unspecified: Secondary | ICD-10-CM

## 2019-07-25 DIAGNOSIS — E559 Vitamin D deficiency, unspecified: Secondary | ICD-10-CM | POA: Diagnosis not present

## 2019-07-25 DIAGNOSIS — Z8619 Personal history of other infectious and parasitic diseases: Secondary | ICD-10-CM

## 2019-07-25 DIAGNOSIS — Z0000 Encounter for general adult medical examination without abnormal findings: Secondary | ICD-10-CM

## 2019-07-26 LAB — COMPREHENSIVE METABOLIC PANEL
ALT: 16 IU/L (ref 0–32)
AST: 22 IU/L (ref 0–40)
Albumin/Globulin Ratio: 1.7 (ref 1.2–2.2)
Albumin: 4.5 g/dL (ref 3.8–4.9)
Alkaline Phosphatase: 55 IU/L (ref 39–117)
BUN/Creatinine Ratio: 13 (ref 9–23)
BUN: 12 mg/dL (ref 6–24)
Bilirubin Total: 0.4 mg/dL (ref 0.0–1.2)
CO2: 26 mmol/L (ref 20–29)
Calcium: 9.4 mg/dL (ref 8.7–10.2)
Chloride: 99 mmol/L (ref 96–106)
Creatinine, Ser: 0.92 mg/dL (ref 0.57–1.00)
GFR calc Af Amer: 79 mL/min/{1.73_m2} (ref >59)
GFR calc non Af Amer: 68 mL/min/{1.73_m2} (ref >59)
Globulin, Total: 2.6 g/dL (ref 1.5–4.5)
Glucose: 96 mg/dL (ref 65–99)
Potassium: 4.7 mmol/L (ref 3.5–5.2)
Sodium: 139 mmol/L (ref 134–144)
Total Protein: 7.1 g/dL (ref 6.0–8.5)

## 2019-07-26 LAB — SAR COV2 SEROLOGY (COVID19)AB(IGG),IA

## 2019-07-26 LAB — CBC WITH DIFFERENTIAL/PLATELET
Basophils Absolute: 0.1 10*3/uL (ref 0.0–0.2)
Basos: 1 %
EOS (ABSOLUTE): 0.1 10*3/uL (ref 0.0–0.4)
Eos: 1 %
Hematocrit: 45.8 % (ref 34.0–46.6)
Hemoglobin: 15 g/dL (ref 11.1–15.9)
Immature Grans (Abs): 0 10*3/uL (ref 0.0–0.1)
Immature Granulocytes: 0 %
Lymphocytes Absolute: 1.2 10*3/uL (ref 0.7–3.1)
Lymphs: 27 %
MCH: 28.6 pg (ref 26.6–33.0)
MCHC: 32.8 g/dL (ref 31.5–35.7)
MCV: 87 fL (ref 79–97)
Monocytes Absolute: 0.3 10*3/uL (ref 0.1–0.9)
Monocytes: 7 %
Neutrophils Absolute: 2.9 10*3/uL (ref 1.4–7.0)
Neutrophils: 64 %
Platelets: 266 10*3/uL (ref 150–450)
RBC: 5.24 x10E6/uL (ref 3.77–5.28)
RDW: 11.9 % (ref 11.7–15.4)
WBC: 4.6 10*3/uL (ref 3.4–10.8)

## 2019-07-26 LAB — EUROIMMUN SARS-COV-2 AB, IGG: Euroimmun SARS-CoV-2 Ab, IgG: NEGATIVE

## 2019-07-26 LAB — LIPID PANEL
Chol/HDL Ratio: 3.4 ratio (ref 0.0–4.4)
Cholesterol, Total: 264 mg/dL — ABNORMAL HIGH (ref 100–199)
HDL: 78 mg/dL (ref >39)
LDL Chol Calc (NIH): 162 mg/dL — ABNORMAL HIGH (ref 0–99)
Triglycerides: 136 mg/dL (ref 0–149)
VLDL Cholesterol Cal: 24 mg/dL (ref 5–40)

## 2019-07-26 LAB — VITAMIN D 25 HYDROXY (VIT D DEFICIENCY, FRACTURES): Vit D, 25-Hydroxy: 26.2 ng/mL — ABNORMAL LOW (ref 30.0–100.0)

## 2019-09-06 ENCOUNTER — Encounter: Payer: Self-pay | Admitting: Family Medicine

## 2020-01-10 ENCOUNTER — Other Ambulatory Visit: Payer: Self-pay

## 2020-01-10 ENCOUNTER — Ambulatory Visit: Payer: Managed Care, Other (non HMO) | Admitting: Gastroenterology

## 2020-01-10 VITALS — BP 142/96 | HR 59 | Temp 97.9°F | Ht 68.0 in | Wt 172.6 lb

## 2020-01-10 DIAGNOSIS — K219 Gastro-esophageal reflux disease without esophagitis: Secondary | ICD-10-CM | POA: Diagnosis not present

## 2020-01-10 DIAGNOSIS — R140 Abdominal distension (gaseous): Secondary | ICD-10-CM | POA: Diagnosis not present

## 2020-01-10 NOTE — Progress Notes (Signed)
[name] MD, MRCP(U.K) [address]  [place]  [place],  [address]  Main: [phone]  Fax: [phone]   Gastroenterology Consultation  Referring Provider:     Rita Ohara, MD Primary Care Physician:  Rita Ohara, MD Primary Gastroenterologist:  Dr. [name]  Reason for Consultation:     Acid reflux        HPI:   Annette Marks is a 59 y.o. y/o female self-referred for GI issues.  She says that for the past 3 months she has had some abdominal bloating, distention and excessive belching.  Along with the issue of belching she has also had regurgitation of acid-like material in her mouth.  She just commenced on Prevacid 7 days back.  She denies use of any artificial sugars or sweeteners.  Denies consumption of sweet tea, chewing gum.  No weight loss, dysphagia, smoking history, family history of esophageal cancer.  Denies any chest pain either.  Symptoms of bloating and belching along with reflux are worse at night.  Denies any recent weight gain.  She is due for her screening colonoscopy which was last performed 10 years back.  Past Medical History:  Diagnosis Date  . H/O mumps   . H/O varicella   . Urethrocele, female    h/o normal cystoscopy (Dr. Serita Butcher)  . Vitamin D deficiency 2010   23 by Dr. Theda Sers, normal after taking supplements (recurrent in 2016)    Past Surgical History:  Procedure Laterality Date  . [place]   left--torn ligaments/acl  . MOHS SURGERY  2016, 09/2017   forehead, left temple (Dr. Sarajane Jews)  . TUBAL LIGATION    . [place]    Prior to Admission medications   Medication Sig Start Date End Date Taking? Authorizing Provider  cholecalciferol (VITAMIN D) 1000 UNITS tablet Take 2,000 Units by mouth daily.     [provider]    Family History  Problem Relation Age of Onset  . Hyperlipidemia Father   . Hypertension Father   . Congestive Heart Failure Father   . Immunodeficiency  Mother        thrombocythemia  . Arthritis Mother        joint pains; on methotrexate, sees rheum; unspec connective tissue dz  . Sjogren's syndrome Mother   . Osteoporosis Mother   . Raynaud syndrome Mother   . Hypertension Mother   . Neuropathy Mother   . Depression Sister   . Anxiety disorder Sister   . Hypothyroidism Daughter   . Hypothyroidism Paternal Grandmother   . Diabetes Neg Hx   . Cancer Neg Hx      Social History   Tobacco Use  . Smoking status: Never Smoker  . Smokeless tobacco: Never Used  Substance Use Topics  . Alcohol use: Not Currently    Comment: stopped drinking (fewer hot flashes without alcohol)  . Drug use: No    Allergies as of 01/10/2020  . (No Known Allergies)    Review of Systems:    All systems reviewed and negative except where noted in HPI.   Physical Exam:  BP (!) 142/96   Pulse (!) 59   Temp 97.9 F (36.6 C)   Ht 5\' 8"  (1.727 m)   Wt 172 lb 9.6 oz (78.3 kg)   LMP 09/06/2011   BMI 26.24 kg/m  Patient's last menstrual period was 09/06/2011. Psych:  Alert and cooperative. Normal mood and affect. General:   Alert,  Well-developed, well-nourished, pleasant and cooperative in NAD Head:  Normocephalic and atraumatic. Eyes:  Sclera clear, no icterus.   Conjunctiva pink. Ears:  Normal auditory acuity. Abdomen:  Normal bowel sounds.  No bruits.  Soft, non-tender and non-distended without masses, hepatosplenomegaly or hernias noted.  No guarding or rebound tenderness.    Extremities:  No clubbing or edema.  No cyanosis. Neurologic:  Alert and oriented x3;  grossly normal neurologically. Psych:  Alert and cooperative. Normal mood and affect.  Imaging Studies: No results found.  Assessment and Plan:   Annette Marks is a 59 y.o. y/o female is here to see me for symptoms of gas bloating abdominal distention and acid reflux.  Based on her history it is very likely that she has bacterial overgrowth syndrome causing her to belch  excessively causing her in turn to develop acid reflux.  The only recent illness she recollects was a respiratory illness in February 2021 when although she tested for negative for Covid recollects she was extremely sick.  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  Plan 1.  Low FODMAP diet, as needed use of charcoal tablets, advised to not take the charcoal tablets with her other medications.  Provided her patient information on low FODMAP diet. 2.  GERD: Continue Prevacid, counseled about acid reflux, patient information provided.  Picture of a wedge pillow provided. 3.  I will call her up in 2 to 3 weeks time.  If no better options would include testing for bacterial overgrowth syndrome with a hydrogen breath test or empirically treating her with Xifaxan.  Follow up in 2 to 3 weeks telephone visit  Dr [name] MD,MRCP(U.K)

## 2020-01-10 NOTE — Patient Instructions (Signed)

## 2020-01-31 ENCOUNTER — Ambulatory Visit: Payer: Managed Care, Other (non HMO) | Admitting: Gastroenterology

## 2020-01-31 ENCOUNTER — Encounter: Payer: Self-pay | Admitting: *Deleted

## 2020-01-31 NOTE — Progress Notes (Deleted)
Annette Marks , MD [address]  [place]  [place], [place] [address]  Main: [phone]  Fax: [phone]   Primary Care Physician: Rita Ohara, MD  Virtual Visit via Telephone Note  I connected with patient on 01/31/20 at  1:15 PM EDT by telephone and verified that I am speaking with the correct person using two identifiers.   I discussed the limitations, risks, security and privacy concerns of performing an evaluation and management service by telephone and the availability of in person appointments. I also discussed with the patient that there may be a patient responsible charge related to this service. The patient expressed understanding and agreed to proceed.  Location of Patient: Home Location of Provider: Home Persons involved: Patient and provider only   History of Present Illness:   Follow up for bloating.   HPI: Annette Marks is a 59 y.o. female    Summary of history :  For the past 3 months she has had some abdominal bloating, distention and excessive belching.  Along with the issue of belching she has also had regurgitation of acid-like material in her mouth.  She just commenced on Prevacid 7 days back.  She denied use of any artificial sugars or sweeteners,  sweet tea, chewing gum.  No weight loss, dysphagia, smoking history, family history of esophageal cancer.  Denies any chest pain either.  Symptoms of bloating and belching along with reflux are worse at night.  Denies any recent weight gain.  She is due for her screening colonoscopy which was last performed 10 years back.   Interval history   01/10/2020- 01/31/2020    ***   Current Outpatient Medications  Medication Sig Dispense Refill  . cholecalciferol (VITAMIN D) 1000 UNITS tablet Take 2,000 Units by mouth daily.      No current facility-administered medications for this visit.    Allergies as of 01/31/2020  . (No Known Allergies)    Review of Systems:    All systems reviewed and  negative except where noted in HPI.   Observations/Objective:  Labs: CMP     Component Value Date/Time   NA 139 07/25/2019 0931   K 4.7 07/25/2019 0931   CL 99 07/25/2019 0931   CO2 26 07/25/2019 0931   GLUCOSE 96 07/25/2019 0931   GLUCOSE 94 07/16/2017 0915   BUN 12 07/25/2019 0931   CREATININE 0.92 07/25/2019 0931   CREATININE 0.80 07/16/2017 0915   CALCIUM 9.4 07/25/2019 0931   PROT 7.1 07/25/2019 0931   ALBUMIN 4.5 07/25/2019 0931   AST 22 07/25/2019 0931   ALT 16 07/25/2019 0931   ALKPHOS 55 07/25/2019 0931   BILITOT 0.4 07/25/2019 0931   GFRNONAA 68 07/25/2019 0931   GFRAA 79 07/25/2019 0931   Lab Results  Component Value Date   WBC 4.6 07/25/2019   HGB 15.0 07/25/2019   HCT 45.8 07/25/2019   MCV 87 07/25/2019   PLT 266 07/25/2019    Imaging Studies: No results found.  Assessment and Plan:   Annette Marks is a 59 y.o. y/o female here to follow up for symptoms of  gas bloating abdominal distention and acid reflux.  Based on her history it is very likely that she has bacterial overgrowth syndrome causing her to belch excessively causing her in turn to develop acid reflux.  The only recent illness she recollects was a respiratory illness in February 2021 when although she tested for negative for Covid recollects she was extremely sick.  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  Plan 1.  Low FODMAP diet, as needed use of charcoal tablets, advised to not take the charcoal tablets with her other medications.  Provided her patient information on low FODMAP diet. 2.  GERD: Continue Prevacid, counseled about acid reflux, patient information provided.  Picture of a wedge pillow provided. 3.  I will call her up in 2 to 3 weeks time.  If no better options would include testing for bacterial overgrowth, celiac serology syndrome with a hydrogen breath test or empirically treating her with Xifaxan.    I discussed the assessment and treatment plan with  the patient. The patient was provided an opportunity to ask questions and all were answered. The patient agreed with the plan and demonstrated an understanding of the instructions.   The patient was advised to call back or seek an in-person evaluation if the symptoms worsen or if the condition fails to improve as anticipated.  I provided *** minutes of non-face-to-face time during this encounter.  Dr Annette Bellows MD,MRCP [facility]) Gastroenterology/Hepatology Pager: [phone]   Speech recognition software was used to dictate this note.

## 2020-02-23 ENCOUNTER — Encounter (INDEPENDENT_AMBULATORY_CARE_PROVIDER_SITE_OTHER): Payer: Managed Care, Other (non HMO) | Admitting: Gastroenterology

## 2020-02-23 NOTE — Progress Notes (Signed)
error 

## 2020-02-27 ENCOUNTER — Ambulatory Visit: Payer: Managed Care, Other (non HMO) | Admitting: Gastroenterology

## 2020-02-27 ENCOUNTER — Other Ambulatory Visit: Payer: Self-pay

## 2020-02-27 VITALS — BP 171/96 | HR 52 | Temp 97.7°F | Ht 68.0 in | Wt 172.0 lb

## 2020-02-27 DIAGNOSIS — R140 Abdominal distension (gaseous): Secondary | ICD-10-CM | POA: Diagnosis not present

## 2020-02-27 DIAGNOSIS — K219 Gastro-esophageal reflux disease without esophagitis: Secondary | ICD-10-CM

## 2020-02-27 NOTE — Progress Notes (Signed)
   [name] MD, MRCP(U.K) [address]  [place]  [place], [place] [address]  Main: [phone]  Fax: [phone]   Primary Care Physician: Rita Ohara, MD  Primary Gastroenterologist:  Dr. [name]   No chief complaint on file.   HPI: Annette Marks is a 59 y.o. female   Summary of history :  Initially seen and referred for 3 months of abdominal bloating distention and excessive belching.Along with the issue of belching she has also had regurgitation of acid-like material in her mouth. She just commenced on Prevacid.  She denies use of any artificial sugars or sweeteners, sweet tea, chewing gum. No weight loss, dysphagia, smoking history, family history of esophageal cancer. Denies any chest pain either. Symptoms of bloating and belching along with reflux are worse at night. Denies any recent weight gain. She is due for her screening colonoscopy which was last performed 10 years back.  Interval history   01/10/2020-02/27/2020  She states that since her last visit the Prevacid did not make any difference.  While she went on a low FODMAP diet all the symptoms resolved.  Although does have extensively finds it hard to be compliant with.   Current Outpatient Medications  Medication Sig Dispense Refill  . cholecalciferol (VITAMIN D) 1000 UNITS tablet Take 2,000 Units by mouth daily.      No current facility-administered medications for this visit.    Allergies as of 02/27/2020  . (No Known Allergies)    ROS:  General: Negative for anorexia, weight loss, fever, chills, fatigue, weakness. ENT: Negative for hoarseness, difficulty swallowing , nasal congestion. CV: Negative for chest pain, angina, palpitations, dyspnea on exertion, peripheral edema.  Respiratory: Negative for dyspnea at rest, dyspnea on exertion, cough, sputum, wheezing.  GI: See history of present illness. GU:  Negative for dysuria, hematuria, urinary incontinence, urinary frequency,  nocturnal urination.  Endo: Negative for unusual weight change.    Physical Examination:   LMP 09/06/2011   General: Well-nourished, well-developed in no acute distress.  Eyes: No icterus. Conjunctivae pink. Psych: Alert and cooperative, normal mood and affect.   Imaging Studies: No results found.  Assessment and Plan:    Annette Marks is a 59 y.o. y/o female to follow-up for gas bloating abdominal distention and acid reflux. Based on her history it is very likely that she has bacterial overgrowth syndrome causing her to belch excessively causing her in turn to develop acid reflux.  Responded very well to low FODMAP diet.  Main issue is hard to follow.  Provided her options of empirically treating for SIBO with Xifaxan versus testing with a breath test and then treating.  Or long-term use of low FODMAP diet.  Plan 1. Celiac serology since she has symptoms when she consumes wheat 2.  She would like to be tested for SIBO and treated subsequently.  Explained the process of testing and treating 3.  Can continue low FODMAP diet 4. Stop Prevacid since it does not help  Dr [name]  MD,MRCP [facility]) Follow up in 3 to 4 weeks

## 2020-02-28 LAB — CELIAC DISEASE AB SCREEN W/RFX
Antigliadin Abs, IgA: 5 units (ref 0–19)
IgA/Immunoglobulin A, Serum: 175 mg/dL (ref 87–352)
Transglutaminase IgA: <2 U/mL (ref 0–3)

## 2020-02-29 ENCOUNTER — Encounter: Payer: Self-pay | Admitting: Gastroenterology

## 2020-03-02 ENCOUNTER — Other Ambulatory Visit: Payer: Self-pay

## 2020-03-02 MED ORDER — RIFAXIMIN 550 MG PO TABS: 550.0000 mg | ORAL_TABLET | Freq: Three times a day (TID) | ORAL | 0 refills | Status: AC

## 2020-05-23 DIAGNOSIS — D259 Leiomyoma of uterus, unspecified: Secondary | ICD-10-CM

## 2020-05-23 HISTORY — DX: Leiomyoma of uterus, unspecified: D25.9

## 2020-05-31 ENCOUNTER — Other Ambulatory Visit: Payer: Self-pay

## 2020-05-31 ENCOUNTER — Telehealth (INDEPENDENT_AMBULATORY_CARE_PROVIDER_SITE_OTHER): Payer: Managed Care, Other (non HMO) | Admitting: Gastroenterology

## 2020-05-31 ENCOUNTER — Ambulatory Visit
Admission: RE | Admit: 2020-05-31 | Discharge: 2020-05-31 | Disposition: A | Discharge status: Home / Self Care | Payer: Managed Care, Other (non HMO) | Source: Ambulatory Visit | Attending: Gastroenterology | Admitting: Gastroenterology

## 2020-05-31 ENCOUNTER — Other Ambulatory Visit: Payer: Self-pay | Admitting: Gastroenterology

## 2020-05-31 DIAGNOSIS — Z1211 Encounter for screening for malignant neoplasm of colon: Secondary | ICD-10-CM

## 2020-05-31 DIAGNOSIS — R140 Abdominal distension (gaseous): Secondary | ICD-10-CM

## 2020-05-31 NOTE — Progress Notes (Signed)
[name] , MD [address]  [place]  [place], [place] [address]  Main: [phone]  Fax: [phone]   Primary Care Physician: Rita Ohara, MD  Virtual Visit via Telephone Note  I connected with patient on 05/31/20 at  2:00 PM EDT by telephone and verified that I am speaking with the correct person using two identifiers.   I discussed the limitations, risks, security and privacy concerns of performing an evaluation and management service by telephone and the availability of in person appointments. I also discussed with the patient that there may be a patient responsible charge related to this service. The patient expressed understanding and agreed to proceed.  Location of Patient: Home Location of Provider: Home Persons involved: Patient and provider only   History of Present Illness: Chief Complaint  Patient presents with  . Follow-up    HPI: Annette Marks is a 59 y.o. female   Summary of history :  Initially seen and referred for 3 months of abdominal bloating distention and excessive belching.Along with the issue of belching she has also had regurgitation of acid-like material in her mouth. She just commenced on Prevacid.  She denies use of any artificial sugars or sweeteners, sweet tea, chewing gum. No weight loss, dysphagia, smoking history, family history of esophageal cancer. Denies any chest pain either. Symptoms of bloating and belching along with reflux are worse at night. Denies any recent weight gain. She is due for her screening colonoscopy which was last performed 10 years back.  Interval history6/03/2020-05/31/2020  Hydrogen breath test performed on 05/15/2020 was normal. 02/27/2020: Celiac serology negative  She has been previously treated with Xifaxan for 2 weeks and states that all the symptoms of gas and bloating resolved but there was recurrence of symptoms a week after completing the course of antibiotics.  Last colonoscopy was over 10  years back and she is due for one and would like to schedule I.  She has lost about 10 pounds of weight as she has been avoiding a lot of fast and sweets which she usually enjoys.  No abdominal pain.      Current Outpatient Medications  Medication Sig Dispense Refill  . cholecalciferol (VITAMIN D) 1000 UNITS tablet Take 2,000 Units by mouth daily.      No current facility-administered medications for this visit.    Allergies as of 05/31/2020  . (No Known Allergies)    Review of Systems:    All systems reviewed and negative except where noted in HPI.   Observations/Objective:  Labs: CMP     Component Value Date/Time   NA 139 07/25/2019 0931   K 4.7 07/25/2019 0931   CL 99 07/25/2019 0931   CO2 26 07/25/2019 0931   GLUCOSE 96 07/25/2019 0931   GLUCOSE 94 07/16/2017 0915   BUN 12 07/25/2019 0931   CREATININE 0.92 07/25/2019 0931   CREATININE 0.80 07/16/2017 0915   CALCIUM 9.4 07/25/2019 0931   PROT 7.1 07/25/2019 0931   ALBUMIN 4.5 07/25/2019 0931   AST 22 07/25/2019 0931   ALT 16 07/25/2019 0931   ALKPHOS 55 07/25/2019 0931   BILITOT 0.4 07/25/2019 0931   GFRNONAA 68 07/25/2019 0931   GFRAA 79 07/25/2019 0931   Lab Results  Component Value Date   WBC 4.6 07/25/2019   HGB 15.0 07/25/2019   HCT 45.8 07/25/2019   MCV 87 07/25/2019   PLT 266 07/25/2019    Imaging Studies: No results found.  Assessment and Plan:  [name] is a 59 y.o. y/o female here to follow-up forgas bloating abdominal distention and acid reflux. Based on her history it is very likely that she has bacterial overgrowth syndrome causing her to belch excessively causing her in turn to develop acid reflux.  Responded very well to low FODMAP diet.  Main issue is hard to follow.  Surprisingly the hydrogen breath test was negative for bacterial overgrowth.  Her history suggests that she usually gets gas and bloating 4 to 5 hours after ingestion of food and the breath test only measured  output about 3 hours.  It is possible that we have missed a window, the fact that she has responded in the past to antibiotics that you have been reinforces the diagnosis of possible bacterial overgrowth syndrome.  Plan 1.  Suggest 4-week course of Xifaxan 2.  Schedule screening colonoscopy average risk 3.  Ultrasound abdomen, explained very occasionally in postmenopausal women ovarian cancer can present similarly.   I have discussed alternative options, risks & benefits,  which include, but are not limited to, bleeding, infection, perforation,respiratory complication & drug reaction.  The patient agrees with this plan & written consent will be obtained.    I discussed the assessment and treatment plan with the patient. The patient was provided an opportunity to ask questions and all were answered. The patient agreed with the plan and demonstrated an understanding of the instructions.   The patient was advised to call back or seek an in-person evaluation if the symptoms worsen or if the condition fails to improve as anticipated.  I provided 16 minutes of non-face-to-face time during this encounter.  Dr [name] MD,MRCP [facility]) Gastroenterology/Hepatology Pager: [phone]   Speech recognition software was used to dictate this note.

## 2020-06-01 ENCOUNTER — Encounter: Payer: Self-pay | Admitting: Gastroenterology

## 2020-06-01 ENCOUNTER — Other Ambulatory Visit: Payer: Self-pay

## 2020-06-01 ENCOUNTER — Ambulatory Visit
Admission: RE | Admit: 2020-06-01 | Discharge: 2020-06-01 | Disposition: A | Discharge status: Home / Self Care | Payer: Managed Care, Other (non HMO) | Source: Ambulatory Visit | Attending: Gastroenterology | Admitting: Gastroenterology

## 2020-06-01 ENCOUNTER — Telehealth: Payer: Self-pay

## 2020-06-01 DIAGNOSIS — R140 Abdominal distension (gaseous): Secondary | ICD-10-CM | POA: Insufficient documentation

## 2020-06-01 DIAGNOSIS — Z1211 Encounter for screening for malignant neoplasm of colon: Secondary | ICD-10-CM

## 2020-06-01 MED ORDER — RIFAXIMIN 550 MG PO TABS: 550.0000 mg | ORAL_TABLET | Freq: Three times a day (TID) | ORAL | 0 refills | Status: AC

## 2020-06-01 MED ORDER — NA SULFATE-K SULFATE-MG SULF 17.5-3.13-1.6 GM/177ML PO SOLN: 1.0000 | Freq: Once | ORAL | 0 refills | Status: AC

## 2020-06-01 NOTE — Telephone Encounter (Signed)
Pt has been notified of results.

## 2020-06-01 NOTE — Telephone Encounter (Signed)
-----   Message from [name], MD sent at 06/01/2020  8:43 AM EDT ----- Sherald Hess inform only uterine fibroid seen

## 2020-06-06 ENCOUNTER — Other Ambulatory Visit: Payer: Self-pay | Admitting: Family Medicine

## 2020-06-06 DIAGNOSIS — Z1231 Encounter for screening mammogram for malignant neoplasm of breast: Secondary | ICD-10-CM

## 2020-07-09 ENCOUNTER — Telehealth: Payer: Self-pay

## 2020-07-09 NOTE — Telephone Encounter (Signed)
Pt left vm requesting Dr. Georgeann Oppenheim advice. Pt states her symptoms have return and wants to know what she should do?

## 2020-07-10 NOTE — Telephone Encounter (Signed)
Spoke to patient- didn't tolerate xifaxan- plan to try doxycycline 100 mg BID for 2 weeks, consider allergy testing down the road  Jadijah can you send in doxycycline 100 BID for 14 days

## 2020-07-11 MED ORDER — DOXYCYCLINE HYCLATE 100 MG PO CAPS: 100.0000 mg | ORAL_CAPSULE | Freq: Two times a day (BID) | ORAL | 0 refills | Status: DC

## 2020-07-23 ENCOUNTER — Encounter: Payer: Self-pay | Admitting: Gastroenterology

## 2020-07-24 ENCOUNTER — Other Ambulatory Visit: Payer: Self-pay

## 2020-07-24 NOTE — Progress Notes (Signed)
Sent patient new instructions.

## 2020-07-25 ENCOUNTER — Ambulatory Visit: Payer: Managed Care, Other (non HMO)

## 2020-08-02 ENCOUNTER — Telehealth: Payer: Self-pay

## 2020-08-02 NOTE — Telephone Encounter (Signed)
Returned patients call. Pt had questions regarding covid test. Answered questions and Pt verbalized understanding.

## 2020-08-10 ENCOUNTER — Other Ambulatory Visit: Payer: Self-pay | Admitting: Gastroenterology

## 2020-08-10 MED ORDER — DOXYCYCLINE HYCLATE 100 MG PO CAPS: 100.0000 mg | ORAL_CAPSULE | Freq: Two times a day (BID) | ORAL | 0 refills | Status: AC

## 2020-08-14 ENCOUNTER — Encounter: Payer: Self-pay | Admitting: Gastroenterology

## 2020-08-14 NOTE — Patient Instructions (Addendum)
  HEALTH MAINTENANCE RECOMMENDATIONS:  It is recommended that you get at least 30 minutes of aerobic exercise at least 5 days/week (for weight loss, you may need as much as 60-90 minutes). This can be any activity that gets your heart rate up. This can be divided in 10-15 minute intervals if needed, but try and build up your endurance at least once a week.  Weight bearing exercise is also recommended twice weekly.  Eat a healthy diet with lots of vegetables, fruits and fiber.  "Colorful" foods have a lot of vitamins (ie green vegetables, tomatoes, red peppers, etc).  Limit sweet tea, regular sodas and alcoholic beverages, all of which has a lot of calories and sugar.  Up to 1 alcoholic drink daily may be beneficial for women (unless trying to lose weight, watch sugars).  Drink a lot of water.  Calcium recommendations are 1200-1500 mg daily (1500 mg for postmenopausal women or women without ovaries), and vitamin D 1000 IU daily.  This should be obtained from diet and/or supplements (vitamins), and calcium should not be taken all at once, but in divided doses.  Monthly self breast exams and yearly mammograms for women over the age of 65 is recommended.  Sunscreen of at least SPF 30 should be used on all sun-exposed parts of the skin when outside between the hours of 10 am and 4 pm (not just when at beach or pool, but even with exercise, golf, tennis, and yard work!)  Use a sunscreen that says "broad spectrum" so it covers both UVA and UVB rays, and make sure to reapply every 1-2 hours.  Remember to change the batteries in your smoke detectors when changing your clock times in the spring and fall. Carbon monoxide detectors are recommended for your home.  Use your seat belt every time you are in a car, and please drive safely and not be distracted with cell phones and texting while driving.  I recommend getting the new shingles vaccine (Shingrix). If you have commercial insurance, check with your  insurance to verify what your out of pocket cost may be (usually covered as preventative, but better to verify to avoid any surprises, as this vaccine is expensive), and then schedule a nurse visit at our office when convenient (based on the possible side effects as discussed).   This is a series of 2 injections, spaced 2 months apart.  It doesn't have to be exactly 2 months apart (but can't be under 2 months), if that isn't feasible for your schedule, but try and get them close to 2 months (and definitely within 6 months of each other, or else the efficacy of the vaccine drops off).  Get this at least 2-4 weeks separate from any other vaccine.  COVID booster is recommended--consider switching to 1/2 dose Moderna (in case perhaps some of the GI issues are related to the vaccine??)  Talk to your GI about considering the upper endoscopy to be done at the same time as colonoscopy.

## 2020-08-14 NOTE — Progress Notes (Signed)
Chief Complaint  Patient presents with  . Annual Exam    fasting annual exam with pelvic. Sees Dr. Kathrin Penner and is UTD. No new concerns. Has mammo scheduled for Dec.     Annette Marks is a 59 y.o. female who presents for a complete physical.  She has the following concerns:  She has been seeing GI since April for complaints of gas/belching, bloating.  She was treated with PPI, had negative hydrogen breath test, negative celiac serology.  She was treated with 2 weeks of Xifaxan and symptoms resolved, but recurred a week after completing the course.  She was subsequently given a 4 week course of Xifaxan.  After the 2nd course of xifaxin, she wasn't better.  She has been on low FODMAP diet, but struggling now with eating foods she has eaten in the past (like rice).  She took doxycycline x 2 weeks in September, and "I was a new person", felt better.  She did a sucrose test, and sucrase level was low.  She developed abdominal pain, followed by bloating.  She got put back on doxycycline 4 days ago, but hasn't felt better.  Ongoing upper abdominal pain, which is relieved by belching.  She only feels good when fasting. She uses activated charcoal prn for burping (every 1-2 days, up to BID if needed). Diet is limited to protein; can handle blueberries and some lettuce. She also had pelvic US, which showed uterine fibroid (1.9 cm at fundus), but no ovarian pathology.  She is scheduled for her routine colonoscopy next month. Bowels are less frequent since she is eating differently/less. She is wondering if she should also have EGD and/or CT scan as part of evaluation.  Vitamin D deficiency:  Level was low at 26.2 last year, when taking 2000 IU 4-5 times/week. She was advised to take 2000 IU daily, or to get a 5000 IU dose to take 4x/week.  She is currently taking 2000 IU most days.  Mild hyperlipidemia:  Her LDL has been creeping up over the last 3 years. In 2018 LDL was 124, 149 in 2019, and up to 162  last year.  HDL remained good. She eats mostly chicken and fish, rarely eats beef.  Last year she reported she had cut back on cheese (occasional hard cheese, eats greek yogurt), and was longer eating eggs, so unclear why lipids were higher. She is due for recheck. This year she has had significant diet changes.  No dairy (small amount of hard cheese on an omelet).  Eating egg whites only. Eating red meat 2-3x/week. She denies changes to her hair, skin nails, energy.  +weight loss as reported above.  Lab Results  Component Value Date   CHOL 264 (H) 07/25/2019   HDL 78 07/25/2019   LDLCALC 162 (H) 07/25/2019   TRIG 136 07/25/2019   CHOLHDL 3.4 07/25/2019   BP's at work are 120's/70's.  She had some borderline/elevated BP's in the past. Higher at GI when anxious. BP Readings from Last 3 Encounters:  08/15/20 130/80  02/27/20 (!) 171/96  01/10/20 (!) 142/96     Immunization History  Administered Date(s) Administered  . Hepatitis B 02/20/1985, 04/22/1985, 04/20/1992  . Influenza Whole 06/12/2011, 06/08/2013  . Influenza, Quadrivalent, Recombinant, Inj, Pf 06/09/2019  . Influenza,inj,Quad PF,6+ Mos 06/01/2015  . Influenza-Unspecified 06/26/2016, 06/19/2017, 06/08/2018, 06/05/2020  . PFIZER SARS-COV-2 Vaccination 10/10/2019, 10/31/2019  . PPD Test 12/22/2011, 01/07/2012, 08/22/2013  . Td 01/01/2004  . Tdap 12/22/2011   She gets flu shots at  work Last Pap smear: 06/2018, normal, no high risk HPV (did have HPV on 2018 pap) Last mammogram: 05/2019, scheduled for December. Last colonoscopy: 04/2011, Dr. Watt Climes, polyps (?hyperplastic)--told to f/u in 10 years. She is scheduled for colonoscopy 12/6 with GI at [facility]. Last DEXA: normal through school--heel screen (over 10years ago)  Dentist:4times yearly  Ophtho: wears glasses, goes yearly (Dr. Kathrin Penner) Exercise: Power Yoga (some cardio and weight-bearing) 3x/week, deep stretch yoga on other days.  Walking 4 miles 2-3x/week. Not  running since illness 11/2018 and current GI issues. (hasn't been back to the gym for weight training since [place]).   PMH, PSH, SH and FH were reviewed and updated.  Outpatient Encounter Medications as of 08/15/2020  Medication Sig Note  . cholecalciferol (VITAMIN D) 1000 UNITS tablet Take 2,000 Units by mouth daily.  08/15/2020: Takes 2000 IU most days  . doxycycline (VIBRAMYCIN) 100 MG capsule Take 1 capsule (100 mg total) by mouth 2 (two) times daily for 14 days. 08/15/2020: 2 wk course started 11/19 from GI   No facility-administered encounter medications on file as of 08/15/2020.   No Known Allergies   ROS: The patient denies fever, headaches, vision changes, decreased hearing, ear pain, sore throat, breast concerns, chest pain, palpitations, dizziness, syncope, dyspnea on exertion, cough, swelling, nausea, vomiting, diarrhea, constipation, melena, hematochezia, hematuria, incontinence, dysuria, vaginal bleeding, discharge, odor or itch, genital lesions, joint pains, numbness, tingling, weakness, tremor, suspicious skin lesions, depression, anxiety, abnormal bleeding/bruising, or enlarged lymph nodes. Sees derm (Dr. Wilhemina Bonito) yearly. Hot flashes are improved. Bloating, belching, abdominal pain and weight loss per HPI Had hip pain on Xifaxin, resolved now.   PHYSICAL EXAM:  BP 130/80   Pulse 60   Ht _0  (1.727 m)   Wt 152 lb 12.8 oz (69.3 kg)   LMP 09/06/2011   BMI 23.23 kg/m   Wt Readings from Last 3 Encounters:  08/15/20 152 lb 12.8 oz (69.3 kg)  02/27/20 172 lb (78 kg)  01/10/20 172 lb 9.6 oz (78.3 kg)    General Appearance:  Alert, cooperative, no distress, appears stated age   Head:  Normocephalic, without obvious abnormality, atraumatic   Eyes:  PERRL, conjunctiva/corneas clear, EOM's intact, fundi benign   Ears:  Normal TM's and external ear canals   Nose:  Not examined, wearing mask due to COVID-19 pandemic  Throat:  Not examined, wearing mask due  to COVID-19 pandemic  Neck:  Supple, no lymphadenopathy; thyroid: no enlargement/tenderness/nodules; no carotid bruit or JVD   Back:  Spine nontender, no curvature, ROM normal, no CVA tenderness   Lungs:  Clear to auscultation bilaterally without wheezes, rales or ronchi; respirations unlabored   Chest Wall:  No tenderness or deformity   Heart:  Regular rate and rhythm, S1 and S2 normal, no murmur, rub or gallop   Breast Exam:  No skin dimpling, nipple inversion, nipple discharge, breast mass or axillary lymphadenopathy  Abdomen:  Soft, non-tender, nondistended, normoactive bowel sounds, no masses, no hepatosplenomegaly. Only rare belch during visit.  Genitalia:  Normal external genitalia. BUS and vagina normal. No cervical motion tenderness. Uterus and adnexa are nontender, not enlarged no mass (L is palpable, not enlarged, R isn't palpable). Pap not performed  Rectal:  Normal sphincter tone, no mass. Heme negative stool  Extremities:  No clubbing, cyanosis or edema   Pulses:  2+ and symmetric all extremities   Skin:  Skin color, texture, turgor normal, no rashes.   Lymph nodes:  Cervical, supraclavicular, and axillary nodes  normal   Neurologic:  Normal strength, sensation and gait; reflexes 2+ and symmetric throughout                 Psych:  Normal mood, affect, hygiene and grooming   ASSESSMENT/PLAN:  Annual physical exam - Plan: POCT Urinalysis DIP (Proadvantage Device), Lipid panel, Comprehensive metabolic panel, CBC with Differential/Platelet, VITAMIN D 25 Hydroxy (Vit-D Deficiency, Fractures), TSH, Hepatitis C antibody  Vitamin D deficiency - taking 2000 IU most days, due for recheck - Plan: VITAMIN D 25 Hydroxy (Vit-D Deficiency, Fractures)  Pure hypercholesterolemia - has been increasing without significant changes in diet; now diet is much different, more protein. Recheck - Plan: Lipid panel, TSH  Need for hepatitis C screening test - Plan:  Hepatitis C antibody  Weight loss - related to dietary restrictions, due to abdominal pain/bloating - Plan: TSH  Sucrose intolerance - recently dx'd.  Awaiting enzyme treatment. Under the care of GI   Vit D, lipids, c-met, CBC, TSH (worsening lipids), HepC   Discussed monthly self breast exams and yearly mammograms; at least 30 minutes of aerobic activity at least 5 days/week, weight-bearing exercise at least 2x/week; proper sunscreen use reviewed; healthy diet, including goals of calcium and vitamin D intake and alcohol recommendations (less than or equal to 1 drink/day) reviewed; regular seatbelt use; changing batteries in smoke detectors. Immunization recommendations discussed--UTD. Continue yearly flu shots. COVID booster recommended--consider changing to [facility], if GI issues are possibly related to vaccine. Shingrix recommended, risks/side effects reviewed, and when to get. Colonoscopy recommendations reviewed--as scheduled next month.  F/u 1 year, sooner prn.

## 2020-08-15 ENCOUNTER — Encounter: Payer: Self-pay | Admitting: Family Medicine

## 2020-08-15 ENCOUNTER — Ambulatory Visit (INDEPENDENT_AMBULATORY_CARE_PROVIDER_SITE_OTHER): Payer: Managed Care, Other (non HMO) | Admitting: Family Medicine

## 2020-08-15 ENCOUNTER — Other Ambulatory Visit: Payer: Self-pay

## 2020-08-15 VITALS — BP 130/80 | HR 60 | Ht 68.0 in | Wt 152.8 lb

## 2020-08-15 DIAGNOSIS — E559 Vitamin D deficiency, unspecified: Secondary | ICD-10-CM | POA: Diagnosis not present

## 2020-08-15 DIAGNOSIS — E7800 Pure hypercholesterolemia, unspecified: Secondary | ICD-10-CM | POA: Diagnosis not present

## 2020-08-15 DIAGNOSIS — Z1159 Encounter for screening for other viral diseases: Secondary | ICD-10-CM | POA: Diagnosis not present

## 2020-08-15 DIAGNOSIS — Z0000 Encounter for general adult medical examination without abnormal findings: Secondary | ICD-10-CM | POA: Diagnosis not present

## 2020-08-15 DIAGNOSIS — R634 Abnormal weight loss: Secondary | ICD-10-CM

## 2020-08-15 DIAGNOSIS — E7431 Sucrase-isomaltase deficiency: Secondary | ICD-10-CM

## 2020-08-15 LAB — POCT URINALYSIS DIP (PROADVANTAGE DEVICE)
Bilirubin, UA: NEGATIVE
Blood, UA: NEGATIVE
Glucose, UA: NEGATIVE mg/dL
Ketones, POC UA: NEGATIVE mg/dL
Leukocytes, UA: NEGATIVE
Nitrite, UA: NEGATIVE
Protein Ur, POC: NEGATIVE mg/dL
Specific Gravity, Urine: 1.01
Urobilinogen, Ur: NEGATIVE
pH, UA: 7 (ref 5.0–8.0)

## 2020-08-15 LAB — LIPID PANEL: Chol/HDL Ratio: 2.7 ratio (ref 0.0–4.4)

## 2020-08-16 LAB — COMPREHENSIVE METABOLIC PANEL
ALT: 24 IU/L (ref 0–32)
AST: 26 IU/L (ref 0–40)
Albumin/Globulin Ratio: 2.3 — ABNORMAL HIGH (ref 1.2–2.2)
Albumin: 4.8 g/dL (ref 3.8–4.9)
Alkaline Phosphatase: 63 IU/L (ref 44–121)
BUN/Creatinine Ratio: 21 (ref 12–28)
BUN: 22 mg/dL (ref 8–27)
Bilirubin Total: 0.5 mg/dL (ref 0.0–1.2)
CO2: 24 mmol/L (ref 20–29)
Calcium: 9.7 mg/dL (ref 8.7–10.3)
Chloride: 102 mmol/L (ref 96–106)
Creatinine, Ser: 1.03 mg/dL — ABNORMAL HIGH (ref 0.57–1.00)
GFR calc Af Amer: 68 mL/min/{1.73_m2} (ref >59)
GFR calc non Af Amer: 59 mL/min/{1.73_m2} — ABNORMAL LOW (ref >59)
Globulin, Total: 2.1 g/dL (ref 1.5–4.5)
Glucose: 88 mg/dL (ref 65–99)
Potassium: 4.4 mmol/L (ref 3.5–5.2)
Sodium: 141 mmol/L (ref 134–144)
Total Protein: 6.9 g/dL (ref 6.0–8.5)

## 2020-08-16 LAB — CBC WITH DIFFERENTIAL/PLATELET
Basophils Absolute: 0.1 10*3/uL (ref 0.0–0.2)
Basos: 1 %
EOS (ABSOLUTE): 0.1 10*3/uL (ref 0.0–0.4)
Eos: 1 %
Hematocrit: 43.8 % (ref 34.0–46.6)
Hemoglobin: 14.9 g/dL (ref 11.1–15.9)
Immature Grans (Abs): 0 10*3/uL (ref 0.0–0.1)
Immature Granulocytes: 0 %
Lymphocytes Absolute: 1.4 10*3/uL (ref 0.7–3.1)
Lymphs: 25 %
MCH: 30.1 pg (ref 26.6–33.0)
MCHC: 34 g/dL (ref 31.5–35.7)
MCV: 89 fL (ref 79–97)
Monocytes Absolute: 0.5 10*3/uL (ref 0.1–0.9)
Monocytes: 9 %
Neutrophils Absolute: 3.6 10*3/uL (ref 1.4–7.0)
Neutrophils: 64 %
Platelets: 256 10*3/uL (ref 150–450)
RBC: 4.95 x10E6/uL (ref 3.77–5.28)
RDW: 11.5 % — ABNORMAL LOW (ref 11.7–15.4)
WBC: 5.7 10*3/uL (ref 3.4–10.8)

## 2020-08-16 LAB — LIPID PANEL
Chol/HDL Ratio: 2.7 ratio (ref 0.0–4.4)
Cholesterol, Total: 239 mg/dL — ABNORMAL HIGH (ref 100–199)
HDL: 87 mg/dL (ref >39)
LDL Chol Calc (NIH): 142 mg/dL — ABNORMAL HIGH (ref 0–99)
Triglycerides: 58 mg/dL (ref 0–149)
VLDL Cholesterol Cal: 10 mg/dL (ref 5–40)

## 2020-08-16 LAB — VITAMIN D 25 HYDROXY (VIT D DEFICIENCY, FRACTURES): Vit D, 25-Hydroxy: 40.2 ng/mL (ref 30.0–100.0)

## 2020-08-16 LAB — HEPATITIS C ANTIBODY: Hep C Virus Ab: <0.1 s/co ratio (ref 0.0–0.9)

## 2020-08-16 LAB — TSH: TSH: 1.83 u[IU]/mL (ref 0.450–4.500)

## 2020-08-22 ENCOUNTER — Telehealth: Payer: Self-pay

## 2020-08-22 NOTE — Telephone Encounter (Signed)
Returned patients call. Pt had questions about procedure. Informed patient of the [facility] facility that offers covid testing. Pt verbalized understanding.

## 2020-08-23 ENCOUNTER — Other Ambulatory Visit
Admission: RE | Admit: 2020-08-23 | Discharge: 2020-08-23 | Disposition: A | Discharge status: Home / Self Care | Payer: Managed Care, Other (non HMO) | Source: Ambulatory Visit | Attending: Gastroenterology | Admitting: Gastroenterology

## 2020-08-24 ENCOUNTER — Other Ambulatory Visit: Payer: Self-pay

## 2020-08-24 ENCOUNTER — Other Ambulatory Visit
Admission: RE | Admit: 2020-08-24 | Discharge: 2020-08-24 | Disposition: A | Discharge status: Home / Self Care | Payer: Managed Care, Other (non HMO) | Source: Ambulatory Visit | Attending: Gastroenterology | Admitting: Gastroenterology

## 2020-08-24 DIAGNOSIS — Z20822 Contact with and (suspected) exposure to covid-19: Secondary | ICD-10-CM | POA: Diagnosis not present

## 2020-08-25 LAB — SARS CORONAVIRUS 2 (TAT 6-24 HRS): SARS Coronavirus 2: NEGATIVE

## 2020-08-27 ENCOUNTER — Other Ambulatory Visit: Payer: Self-pay | Admitting: Family Medicine

## 2020-08-27 ENCOUNTER — Encounter: Admission: RE | Payer: Self-pay | Source: Home / Self Care

## 2020-08-27 ENCOUNTER — Ambulatory Visit
Admission: RE | Admit: 2020-08-27 | Payer: Managed Care, Other (non HMO) | Source: Home / Self Care | Admitting: Gastroenterology

## 2020-08-27 DIAGNOSIS — Z1231 Encounter for screening mammogram for malignant neoplasm of breast: Secondary | ICD-10-CM

## 2020-08-27 SURGERY — COLONOSCOPY WITH PROPOFOL
Anesthesia: General

## 2020-09-04 ENCOUNTER — Ambulatory Visit
Admission: RE | Admit: 2020-09-04 | Discharge: 2020-09-04 | Disposition: A | Discharge status: Home / Self Care | Payer: Managed Care, Other (non HMO) | Source: Ambulatory Visit

## 2020-09-04 ENCOUNTER — Other Ambulatory Visit: Payer: Self-pay

## 2020-09-04 DIAGNOSIS — Z1231 Encounter for screening mammogram for malignant neoplasm of breast: Secondary | ICD-10-CM

## 2020-09-06 ENCOUNTER — Telehealth: Payer: Self-pay | Admitting: Gastroenterology

## 2020-09-06 NOTE — Telephone Encounter (Signed)
Pt wants to reschedule procedure

## 2020-09-10 ENCOUNTER — Other Ambulatory Visit: Payer: Self-pay | Admitting: Family Medicine

## 2020-09-10 DIAGNOSIS — R928 Other abnormal and inconclusive findings on diagnostic imaging of breast: Secondary | ICD-10-CM

## 2020-09-10 NOTE — Telephone Encounter (Signed)
Called patient to reschedule procedure. LVM to call office back. 

## 2020-09-11 ENCOUNTER — Ambulatory Visit
Admission: RE | Admit: 2020-09-11 | Discharge: 2020-09-11 | Disposition: A | Discharge status: Home / Self Care | Payer: Managed Care, Other (non HMO) | Source: Ambulatory Visit | Attending: Family Medicine | Admitting: Family Medicine

## 2020-09-11 ENCOUNTER — Other Ambulatory Visit: Payer: Self-pay

## 2020-09-11 ENCOUNTER — Other Ambulatory Visit: Payer: Self-pay | Admitting: Family Medicine

## 2020-09-11 DIAGNOSIS — R928 Other abnormal and inconclusive findings on diagnostic imaging of breast: Secondary | ICD-10-CM

## 2020-09-11 DIAGNOSIS — N6489 Other specified disorders of breast: Secondary | ICD-10-CM

## 2020-09-18 ENCOUNTER — Other Ambulatory Visit: Payer: Self-pay

## 2020-09-18 DIAGNOSIS — K219 Gastro-esophageal reflux disease without esophagitis: Secondary | ICD-10-CM

## 2020-09-18 DIAGNOSIS — Z1211 Encounter for screening for malignant neoplasm of colon: Secondary | ICD-10-CM

## 2020-09-18 MED ORDER — SUTAB 1479-225-188 MG PO TABS: 376.0000 mg | ORAL_TABLET | Freq: Once | ORAL | 0 refills | Status: AC

## 2020-09-18 NOTE — Progress Notes (Signed)
Procedure has been rescheduled and updated insurance will be sent out.

## 2020-09-20 ENCOUNTER — Other Ambulatory Visit (HOSPITAL_COMMUNITY): Payer: Self-pay | Admitting: Diagnostic Radiology

## 2020-09-20 ENCOUNTER — Ambulatory Visit
Admission: RE | Admit: 2020-09-20 | Discharge: 2020-09-20 | Disposition: A | Discharge status: Home / Self Care | Payer: Managed Care, Other (non HMO) | Source: Ambulatory Visit | Attending: Family Medicine | Admitting: Family Medicine

## 2020-09-20 ENCOUNTER — Other Ambulatory Visit: Payer: Self-pay

## 2020-09-20 DIAGNOSIS — N6489 Other specified disorders of breast: Secondary | ICD-10-CM

## 2020-09-20 HISTORY — PX: OTHER SURGICAL HISTORY: SHX169

## 2020-09-26 ENCOUNTER — Encounter: Payer: Self-pay | Admitting: *Deleted

## 2020-09-26 ENCOUNTER — Other Ambulatory Visit: Payer: Managed Care, Other (non HMO)

## 2020-09-28 ENCOUNTER — Other Ambulatory Visit: Payer: Self-pay | Admitting: General Surgery

## 2020-09-28 DIAGNOSIS — N6489 Other specified disorders of breast: Secondary | ICD-10-CM

## 2020-10-11 ENCOUNTER — Other Ambulatory Visit
Admission: RE | Admit: 2020-10-11 | Discharge: 2020-10-11 | Disposition: A | Discharge status: Home / Self Care | Payer: Managed Care, Other (non HMO) | Source: Ambulatory Visit | Attending: Gastroenterology | Admitting: Gastroenterology

## 2020-10-11 ENCOUNTER — Other Ambulatory Visit: Payer: Self-pay

## 2020-10-11 DIAGNOSIS — Z20822 Contact with and (suspected) exposure to covid-19: Secondary | ICD-10-CM | POA: Insufficient documentation

## 2020-10-11 DIAGNOSIS — Z01812 Encounter for preprocedural laboratory examination: Secondary | ICD-10-CM | POA: Insufficient documentation

## 2020-10-11 LAB — SARS CORONAVIRUS 2 (TAT 6-24 HRS): SARS Coronavirus 2: NEGATIVE

## 2020-10-15 ENCOUNTER — Encounter
Admission: RE | Disposition: A | Discharge status: Home / Self Care | Payer: Self-pay | Source: Home / Self Care | Attending: Gastroenterology

## 2020-10-15 ENCOUNTER — Other Ambulatory Visit: Payer: Self-pay

## 2020-10-15 ENCOUNTER — Ambulatory Visit
Admission: RE | Admit: 2020-10-15 | Discharge: 2020-10-15 | Disposition: A | Discharge status: Home / Self Care | Payer: Managed Care, Other (non HMO) | Attending: Gastroenterology | Admitting: Gastroenterology

## 2020-10-15 ENCOUNTER — Ambulatory Visit: Payer: Managed Care, Other (non HMO) | Admitting: Certified Registered Nurse Anesthetist

## 2020-10-15 DIAGNOSIS — K319 Disease of stomach and duodenum, unspecified: Secondary | ICD-10-CM | POA: Insufficient documentation

## 2020-10-15 DIAGNOSIS — Z79899 Other long term (current) drug therapy: Secondary | ICD-10-CM | POA: Diagnosis not present

## 2020-10-15 DIAGNOSIS — K219 Gastro-esophageal reflux disease without esophagitis: Secondary | ICD-10-CM

## 2020-10-15 DIAGNOSIS — K2970 Gastritis, unspecified, without bleeding: Secondary | ICD-10-CM | POA: Insufficient documentation

## 2020-10-15 DIAGNOSIS — D125 Benign neoplasm of sigmoid colon: Secondary | ICD-10-CM | POA: Insufficient documentation

## 2020-10-15 DIAGNOSIS — R1013 Epigastric pain: Secondary | ICD-10-CM | POA: Insufficient documentation

## 2020-10-15 DIAGNOSIS — Z1211 Encounter for screening for malignant neoplasm of colon: Secondary | ICD-10-CM | POA: Insufficient documentation

## 2020-10-15 DIAGNOSIS — Z881 Allergy status to other antibiotic agents status: Secondary | ICD-10-CM | POA: Insufficient documentation

## 2020-10-15 DIAGNOSIS — K635 Polyp of colon: Secondary | ICD-10-CM

## 2020-10-15 HISTORY — PX: COLONOSCOPY WITH PROPOFOL: SHX5780

## 2020-10-15 HISTORY — PX: ESOPHAGOGASTRODUODENOSCOPY (EGD) WITH PROPOFOL: SHX5813

## 2020-10-15 SURGERY — COLONOSCOPY WITH PROPOFOL
Anesthesia: General

## 2020-10-15 MED ORDER — PROPOFOL 10 MG/ML IV BOLUS
INTRAVENOUS | Status: DC | PRN
  Administered 2020-10-15: 80 mg via INTRAVENOUS

## 2020-10-15 MED ORDER — MIDAZOLAM HCL 2 MG/2ML IJ SOLN
INTRAMUSCULAR | Status: AC
  Filled 2020-10-15: qty 2

## 2020-10-15 MED ORDER — SODIUM CHLORIDE 0.9 % IV SOLN
INTRAVENOUS | Status: DC
  Administered 2020-10-15: 20 mL/h via INTRAVENOUS

## 2020-10-15 MED ORDER — PROPOFOL 500 MG/50ML IV EMUL
INTRAVENOUS | Status: DC | PRN
  Administered 2020-10-15: 150 ug/kg/min via INTRAVENOUS

## 2020-10-15 MED ORDER — LIDOCAINE HCL (CARDIAC) PF 100 MG/5ML IV SOSY
PREFILLED_SYRINGE | INTRAVENOUS | Status: DC | PRN
  Administered 2020-10-15: 50 mg via INTRAVENOUS

## 2020-10-15 MED ORDER — PROPOFOL 500 MG/50ML IV EMUL
INTRAVENOUS | Status: AC
  Filled 2020-10-15: qty 50

## 2020-10-15 MED ORDER — MIDAZOLAM HCL 2 MG/2ML IJ SOLN
INTRAMUSCULAR | Status: DC | PRN
  Administered 2020-10-15: 2 mg via INTRAVENOUS

## 2020-10-15 NOTE — Op Note (Signed)
[facility] Patient Name: Annette Marks Procedure Date: 10/15/2020 9:41 AM MRN: [id number] Account #: [id number] Date of Birth: 04/10/1961 Admit Type: Outpatient Age: 60 Room: [facility] ENDO ROOM 4 Gender: Female Note Status: Finalized Procedure:             Colonoscopy Indications:           Screening for colorectal malignant neoplasm Providers:             [name] MD, MD Referring MD:          Rita Ohara (Referring MD) Medicines:             Monitored Anesthesia Care Complications:         No immediate complications. Procedure:             Pre-Anesthesia Assessment:                        - Prior to the procedure, a History and Physical was                         performed, and patient medications, allergies and                         sensitivities were reviewed. The patient's tolerance                         of previous anesthesia was reviewed.                        - The risks and benefits of the procedure and the                         sedation options and risks were discussed with the                         patient. All questions were answered and informed                         consent was obtained.                        - ASA Grade Assessment: II - A patient with mild                         systemic disease.                        After obtaining informed consent, the colonoscope was                         passed under direct vision. Throughout the procedure,                         the patient's blood pressure, pulse, and oxygen                         saturations were monitored continuously. The                         Colonoscope was introduced through the anus and  advanced to the the cecum, identified by the                         appendiceal orifice. The colonoscopy was performed                         with ease. The patient tolerated the procedure well.                         The quality of the  bowel preparation was excellent. Findings:      The perianal and digital rectal examinations were normal.      A 5 mm polyp was found in the sigmoid colon. The polyp was sessile. The       polyp was removed with a cold snare. Resection and retrieval were       complete.      The exam was otherwise without abnormality on direct and retroflexion       views. Impression:            - One 5 mm polyp in the sigmoid colon, removed with a                         cold snare. Resected and retrieved.                        - The examination was otherwise normal on direct and                         retroflexion views. Recommendation:        - Discharge patient to home (with escort).                        - Resume previous diet.                        - Continue present medications.                        - Await pathology results.                        - Repeat colonoscopy for surveillance based on                         pathology results. Procedure Code(s):     --- Professional ---                        [phone], Colonoscopy, flexible; with removal of                         tumor(s), polyp(s), or other lesion(s) by snare                         technique Diagnosis Code(s):     --- Professional ---                        Z12.11, Encounter for screening for malignant neoplasm  of colon                        K63.5, Polyp of colon CPT copyright 2019 American Medical Association. All rights reserved. The codes documented in this report are preliminary and upon coder review may  be revised to meet current compliance requirements. [name], MD [name] MD, MD 10/15/2020 10:34:47 AM This report has been signed electronically. Number of Addenda: 0 Note Initiated On: 10/15/2020 9:41 AM Scope Withdrawal Time: 0 hours 12 minutes 39 seconds  Total Procedure Duration: 0 hours 18 minutes 55 seconds  Estimated Blood Loss:  Estimated blood loss: none.      [facility]

## 2020-10-15 NOTE — Op Note (Signed)
[facility] Patient Name: Annette Marks Procedure Date: 10/15/2020 9:42 AM MRN: [id number] Account #: [id number] Date of Birth: 1960/10/15 Admit Type: Outpatient Age: 60 Room: [facility] ENDO ROOM 4 Gender: Female Note Status: Finalized Procedure:             Upper GI endoscopy Indications:           Dyspepsia Providers:             [name] MD, MD Referring MD:          Rita Ohara (Referring MD) Medicines:             Monitored Anesthesia Care Complications:         No immediate complications. Procedure:             Pre-Anesthesia Assessment:                        - Prior to the procedure, a History and Physical was                         performed, and patient medications, allergies and                         sensitivities were reviewed. The patient's tolerance                         of previous anesthesia was reviewed.                        - The risks and benefits of the procedure and the                         sedation options and risks were discussed with the                         patient. All questions were answered and informed                         consent was obtained.                        - ASA Grade Assessment: II - A patient with mild                         systemic disease.                        After obtaining informed consent, the endoscope was                         passed under direct vision. Throughout the procedure,                         the patient's blood pressure, pulse, and oxygen                         saturations were monitored continuously. The Endoscope                         was introduced through the mouth, and advanced to the  third part of duodenum. The upper GI endoscopy was                         accomplished with ease. The patient tolerated the                         procedure well. Findings:      The esophagus was normal.      Localized moderate inflammation  characterized by congestion (edema) and       erythema was found in the gastric antrum. Biopsies were taken with a       cold forceps for histology.      The examined duodenum was normal. Biopsies were taken with a cold       forceps for histology. Biopsies were taken with a cold forceps for       histology. Biopsies of duodenum taken to rule out celiac disease and       disacharidasses deficiency Impression:            - Normal esophagus.                        - Gastritis. Biopsied.                        - Normal examined duodenum. Biopsied. Recommendation:        - Await pathology results.                        - Perform a colonoscopy today. Procedure Code(s):     --- Professional ---                        [id number], Esophagogastroduodenoscopy, flexible,                         transoral; with biopsy, single or multiple Diagnosis Code(s):     --- Professional ---                        K29.70, Gastritis, unspecified, without bleeding                        R10.13, Epigastric pain CPT copyright 2019 American Medical Association. All rights reserved. The codes documented in this report are preliminary and upon coder review may  be revised to meet current compliance requirements. [name], MD [name] MD, MD 10/15/2020 10:10:27 AM This report has been signed electronically. Number of Addenda: 0 Note Initiated On: 10/15/2020 9:42 AM Estimated Blood Loss:  Estimated blood loss: none.      [facility]

## 2020-10-15 NOTE — Anesthesia Preprocedure Evaluation (Addendum)
Anesthesia Evaluation  Patient identified by MRN, date of birth, ID band Patient awake    Reviewed: Allergy & Precautions, H&P , NPO status , Patient's Chart, lab work & pertinent test results  History of Anesthesia Complications Negative for: history of anesthetic complications  Airway Mallampati: II  TM Distance: >3 FB     Dental  (+) Teeth Intact   Pulmonary neg pulmonary ROS, neg sleep apnea, neg COPD,    breath sounds clear to auscultation       Cardiovascular (-) angina(-) Past MI and (-) Cardiac Stents negative cardio ROS  (-) dysrhythmias  Rhythm:regular Rate:Normal     Neuro/Psych negative neurological ROS  negative psych ROS   GI/Hepatic Neg liver ROS, GERD  ,  Endo/Other  negative endocrine ROS  Renal/GU negative Renal ROS  negative genitourinary   Musculoskeletal   Abdominal   Peds  Hematology negative hematology ROS (+)   Anesthesia Other Findings Pt is very anxious  Past Medical History: No date: H/O mumps No date: H/O varicella No date: Urethrocele, female     Comment:  h/o normal cystoscopy (Dr. Serita Butcher) 2010: Vitamin D deficiency     Comment:  23 by Dr. Theda Sers, normal after taking supplements               (recurrent in 2016)  Past Surgical History: 1979, 1997: KNEE SURGERY     Comment:  left--torn ligaments/acl 2016, 09/2017: MOHS SURGERY     Comment:  forehead, left temple (Dr. Sarajane Jews) 09/20/2020: needle core biopsy; Right     Comment:  Right breast;within the complex sclerosing lesion, there              is epithelial hyperplasia and apocrine               metaplasia;excisional biopsy is recommended No date: TUBAL LIGATION 1979: WISDOM TOOTH EXTRACTION  BMI    Body Mass Index: 21.74 kg/m      Reproductive/Obstetrics negative OB ROS                             Anesthesia Physical Anesthesia Plan  ASA: I  Anesthesia Plan: General   Post-op  Pain Management:    Induction:   PONV Risk Score and Plan: Propofol infusion and TIVA  Airway Management Planned: Nasal Cannula  Additional Equipment:   Intra-op Plan:   Post-operative Plan:   Informed Consent: I have reviewed the patients History and Physical, chart, labs and discussed the procedure including the risks, benefits and alternatives for the proposed anesthesia with the patient or authorized representative who has indicated his/her understanding and acceptance.     Dental Advisory Given  Plan Discussed with: Anesthesiologist, CRNA and Surgeon  Anesthesia Plan Comments:        Anesthesia Quick Evaluation

## 2020-10-15 NOTE — Transfer of Care (Signed)
Immediate Anesthesia Transfer of Care Note  Patient: Annette Marks  Procedure(s) Performed: COLONOSCOPY WITH PROPOFOL (N/A ) ESOPHAGOGASTRODUODENOSCOPY (EGD) WITH PROPOFOL (N/A )  Patient Location: PACU  Anesthesia Type:General  Level of Consciousness: sedated  Airway & Oxygen Therapy: Patient Spontanous Breathing  Post-op Assessment: Report given to RN and Post -op Vital signs reviewed and stable  Post vital signs: Reviewed and stable  Last Vitals:  Vitals Value Taken Time  BP 106/63 10/15/20 1037  Temp 35.6 C 10/15/20 1037  Pulse 50 10/15/20 1037  Resp 17 10/15/20 1039  SpO2 100 % 10/15/20 1037  Vitals shown include unvalidated device data.  Last Pain:  Vitals:   10/15/20 1037  TempSrc: Temporal  PainSc: 0-No pain         Complications: No complications documented.

## 2020-10-15 NOTE — Anesthesia Procedure Notes (Signed)
Date/Time: 10/15/2020 9:55 AM Performed by: Johnna Acosta, CRNA Pre-anesthesia Checklist: Patient identified, Emergency Drugs available, Patient being monitored, Timeout performed and Suction available Patient Re-evaluated:Patient Re-evaluated prior to induction Oxygen Delivery Method: Nasal cannula Preoxygenation: Pre-oxygenation with 100% oxygen Induction Type: IV induction

## 2020-10-15 NOTE — H&P (Signed)
[name], MD [address], [place], [place], [place], [address] [address], [place], [place], [place], [address] Phone: [phone]  Fax: [phone]  Primary Care Physician:  Rita Ohara, MD   Pre-Procedure History & Physical: HPI:  Harrietta Incorvaia Naill is a 60 y.o. female is here for an endoscopy and colonoscopy    Past Medical History:  Diagnosis Date  . H/O mumps   . H/O varicella   . Urethrocele, female    h/o normal cystoscopy (Dr. Serita Butcher)  . Vitamin D deficiency 2010   23 by Dr. Theda Sers, normal after taking supplements (recurrent in 2016)    Past Surgical History:  Procedure Laterality Date  . [place]   left--torn ligaments/acl  . MOHS SURGERY  2016, 09/2017   forehead, left temple (Dr. Sarajane Jews)  . needle core biopsy Right 09/20/2020   Right breast;within the complex sclerosing lesion, there is epithelial hyperplasia and apocrine metaplasia;excisional biopsy is recommended  . TUBAL LIGATION    . [place]    Prior to Admission medications   Medication Sig Start Date End Date Taking? Authorizing Provider  cholecalciferol (VITAMIN D) 1000 UNITS tablet Take 2,000 Units by mouth daily.    Yes [provider]    Allergies as of 09/18/2020 - Review Complete 08/15/2020  Allergen Reaction Noted  . Xifaxan [rifaximin] Other (See Comments) 08/15/2020    Family History  Problem Relation Age of Onset  . Hyperlipidemia Father   . Hypertension Father   . Congestive Heart Failure Father   . Immunodeficiency Mother        thrombocythemia  . Arthritis Mother        joint pains; on methotrexate, sees rheum; unspec connective tissue dz  . Sjogren's syndrome Mother   . Osteoporosis Mother   . Raynaud syndrome Mother   . Hypertension Mother   . Neuropathy Mother   . Depression Sister   . Anxiety disorder Sister   . Hypothyroidism Daughter   . Hypothyroidism Paternal Grandmother   . Diabetes Neg Hx   . Cancer  Neg Hx     Social History   Socioeconomic History  . Marital status: Married    Spouse name: Not on file  . Number of children: 2  . Years of education: Not on file  . Highest education level: Not on file  Occupational History  . Occupation: Programmer, multimedia: [employer]  Tobacco Use  . Smoking status: Never Smoker  . Smokeless tobacco: Never Used  Vaping Use  . Vaping Use: Never used  Substance and Sexual Activity  . Alcohol use: Not Currently    Comment: stopped drinking (fewer hot flashes without alcohol)  . Drug use: No  . Sexual activity: Yes    Partners: Male    Birth control/protection: Surgical    Comment: BTL  Other Topics Concern  . Not on file  Social History Narrative   Re-married 07/2015 (lives in [place]). Daughter is Engineer, maintenance (IT) in [place], son in [place] (works in [place]). No pets. RN at [employer].  Got degree in nursing informatics from [employer] Strain: Not on file  Food Insecurity: Not on file  Transportation Needs: Not on file  Physical Activity: Not on file  Stress: Not on file  Social Connections: Not on file  Intimate Partner Violence: Not on file    Review of Systems: See  HPI, otherwise negative ROS  Physical Exam: BP (!) 155/94   Pulse 64   Temp (!) 97 F (36.1 C) (Temporal)   Resp 20   Ht 5\' 8"  (1.727 m)   Wt 64.9 kg   LMP 09/06/2011   SpO2 97%   BMI 21.74 kg/m  General:   Alert,  pleasant and cooperative in NAD Head:  Normocephalic and atraumatic. Neck:  Supple; no masses or thyromegaly. Lungs:  Clear throughout to auscultation, normal respiratory effort.    Heart:  +S1, +S2, Regular rate and rhythm, No edema. Abdomen:  Soft, nontender and nondistended. Normal bowel sounds, without guarding, and without rebound.   Neurologic:  Alert and  oriented x4;  grossly normal neurologically.  Impression/Plan: [place] is here for an endoscopy and colonoscopy  to be  performed for  evaluation of bloating, colon cancer screening     Risks, benefits, limitations, and alternatives regarding endoscopy have been reviewed with the patient.  Questions have been answered.  All parties agreeable.   [name], MD  10/15/2020, 9:12 AM

## 2020-10-16 ENCOUNTER — Encounter: Payer: Self-pay | Admitting: Gastroenterology

## 2020-10-16 NOTE — Anesthesia Postprocedure Evaluation (Signed)
Anesthesia Post Note  Patient: Annette Marks  Procedure(s) Performed: COLONOSCOPY WITH PROPOFOL (N/A ) ESOPHAGOGASTRODUODENOSCOPY (EGD) WITH PROPOFOL (N/A )  Patient location during evaluation: PACU Anesthesia Type: General Level of consciousness: awake and alert Pain management: pain level controlled Vital Signs Assessment: post-procedure vital signs reviewed and stable Respiratory status: spontaneous breathing, nonlabored ventilation and respiratory function stable Cardiovascular status: blood pressure returned to baseline and stable Postop Assessment: no apparent nausea or vomiting Anesthetic complications: no   No complications documented.   Last Vitals:  Vitals:   10/15/20 1047 10/15/20 1057  BP: 125/75 127/79  Pulse: 63 (!) 50  Resp: 13 15  Temp:    SpO2: 98% 97%    Last Pain:  Vitals:   10/16/20 0730  TempSrc:   PainSc: 0-No pain                 Brett Canales Tonia Avino

## 2020-10-17 ENCOUNTER — Other Ambulatory Visit: Payer: Self-pay | Admitting: General Surgery

## 2020-10-17 DIAGNOSIS — N6489 Other specified disorders of breast: Secondary | ICD-10-CM

## 2020-10-17 LAB — SURGICAL PATHOLOGY

## 2020-10-29 ENCOUNTER — Telehealth: Payer: Self-pay | Admitting: Gastroenterology

## 2020-10-29 LAB — MISC LABCORP TEST (SEND OUT): Labcorp test code: 19701

## 2020-10-29 NOTE — Telephone Encounter (Signed)
Patient called wanting results from procedure on 10/15/20, please call to advise

## 2020-10-30 NOTE — Telephone Encounter (Signed)
Returned patients call and informed her of the results. Dr. Vicente Males would like to schedule a video visit. Sent him a message to see what day will work best.

## 2020-11-01 ENCOUNTER — Encounter: Payer: Self-pay | Admitting: Gastroenterology

## 2020-11-01 ENCOUNTER — Telehealth (INDEPENDENT_AMBULATORY_CARE_PROVIDER_SITE_OTHER): Payer: Managed Care, Other (non HMO) | Admitting: Gastroenterology

## 2020-11-01 DIAGNOSIS — K9049 Malabsorption due to intolerance, not elsewhere classified: Secondary | ICD-10-CM | POA: Diagnosis not present

## 2020-11-01 DIAGNOSIS — K6389 Other specified diseases of intestine: Secondary | ICD-10-CM

## 2020-11-01 NOTE — Telephone Encounter (Signed)
Yes if she is available

## 2020-11-01 NOTE — Progress Notes (Signed)
Annette Marks , MD [address]  [place]  [place], [place] [address]  Main: [phone]  Fax: [phone]   Primary Care Physician: Rita Ohara, MD  Virtual Visit via Video Note  I connected with patient on 11/01/20 at  1:00 PM EST by video and verified that I am speaking with the correct person using two identifiers.   I discussed the limitations, risks, security and privacy concerns of performing an evaluation and management service by video  and the availability of in person appointments. I also discussed with the patient that there may be a patient responsible charge related to this service. The patient expressed understanding and agreed to proceed.  Location of Patient: Home Location of Provider: Home Persons involved: Patient and provider only   History of Present Illness: Discuss results of recent endoscopy and biopsies  HPI: Annette Marks is a 60 y.o. female   Summary of history :  Initially seen and referred back in April 2021 for abdominal bloating distention and excessive belching.Along with the issue of belching she has also had regurgitation of acid-like material in her mouth. She had just commenced onPrevacid.She denied use of any artificial sugars or sweeteners,sweet tea, chewing gum. No weight loss, dysphagia, smoking history, family history of esophageal cancer. Denies any chest pain either. Symptoms of bloating and belching along with reflux are worse at night. Denies any recent weight gain. She is due for her screening colonoscopy which was last performed 10 years back. Hydrogen breath test performed on 05/15/2020 was normal. 02/27/2020: Celiac serology negative She has been previously treated with Xifaxan for 2 weeks and states that all the symptoms of gas and bloating resolved but there was recurrence of symptoms a week after completing the course of antibiotics.  Subsequently commenced on doxycycline which worked during the first course  really well but the symptoms recurred and had to be given a second course following which did not have good response.   Interval history9/05/2020-11/01/2020  Since her last visit she has had continued symptoms of bloating and gas.  Symptoms are absent when she avoids eating food but recurs as and as she starts eating food  10/15/2020: Colonoscopy: 5 mm polyp in the sigmoid colon resected.  Upper endoscopy erythema seen in the gastric antrum biopsies taken biopsies also taken of the duodenum.  Pathology of the colon polyp was a tubular adenoma.  Biopsies of the stomach showed reactive gastropathy and biopsies of the duodenum showed intact villi.  We sent out biopsies of the duodenum for disaccharidase is testing.  Her lactase enzyme was low at 9.6 when the lower limits were 15.  She had normal enzyme activity for sucrase, maltase and palatinase.      Current Outpatient Medications  Medication Sig Dispense Refill  . cholecalciferol (VITAMIN D) 1000 UNITS tablet Take 2,000 Units by mouth daily.      No current facility-administered medications for this visit.    Allergies as of 11/01/2020 - Review Complete 10/15/2020  Allergen Reaction Noted  . Xifaxan [rifaximin] Other (See Comments) 08/15/2020    Review of Systems:    All systems reviewed and negative except where noted in HPI.  General Appearance:    Alert, cooperative, no distress, appears stated age  Head:    Normocephalic, without obvious abnormality, atraumatic  Eyes:    PERRL, conjunctiva/corneas clear,  Ears:    Grossly normal hearing    Neurologic:  Grossly normal    Observations/Objective:  Labs: CMP  Component Value Date/Time   NA 141 08/15/2020 0949   K 4.4 08/15/2020 0949   CL 102 08/15/2020 0949   CO2 24 08/15/2020 0949   GLUCOSE 88 08/15/2020 0949   GLUCOSE 94 07/16/2017 0915   BUN 22 08/15/2020 0949   CREATININE 1.03 (H) 08/15/2020 0949   CREATININE 0.80 07/16/2017 0915   CALCIUM 9.7 08/15/2020 0949    PROT 6.9 08/15/2020 0949   ALBUMIN 4.8 08/15/2020 0949   AST 26 08/15/2020 0949   ALT 24 08/15/2020 0949   ALKPHOS 63 08/15/2020 0949   BILITOT 0.5 08/15/2020 0949   GFRNONAA 59 (L) 08/15/2020 0949   GFRAA 68 08/15/2020 0949   Lab Results  Component Value Date   WBC 5.7 08/15/2020   HGB 14.9 08/15/2020   HCT 43.8 08/15/2020   MCV 89 08/15/2020   PLT 256 08/15/2020    Imaging Studies: No results found.  Assessment and Plan:   Annette Marks is a 60 y.o. y/o female  here to follow-up forgas bloating abdominal distention and acid reflux. Based on her history it is very likely that she has bacterial overgrowth syndrome causing her to belch excessively causing her in turn to develop acid reflux.Responded very well to low FODMAP diet. Main issue is hard to follow.  Surprisingly the hydrogen breath test was negative for bacterial overgrowth.  Her history suggests that she usually gets gas and bloating 4 to 5 hours after ingestion of food and the breath test only measured output about 3 hours.  It is possible that we have missed a window, the fact that she has responded in the past to antibiotics that you have been reinforces the diagnosis of possible bacterial overgrowth syndrome.  She has previously responded very well for the fourth course of antibiotics for Xifaxan as well as doxycycline.  Subsequently when rechallenged did not have a good response.  Endoscopy showed no blunting of duodenal villi.  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  Normal enzyme activity for sucrase and maltase.  Colonoscopy showed a small tubular adenoma but otherwise was normal.  Abdominal ultrasound in 06/11/2020 was normal.  I even performed a transvaginal ultrasound to rule out any ascites or any abnormalities which showed no significant abnormalities.  I explained to her that I would recommend a second opinion at [facility] to be evaluated for  small bowel bacterial overgrowth syndrome and probably retesting along with evaluation for carbohydrate intolerance.  In the meanwhile suggest her to commence on Lactaid pills before meals to check if she would get any benefit.  I did discuss that the only test we probably have not done is a CT abdomen.  My pretest probability for any other abnormality in the abdomen is low.  I suggested this could be something she can discuss at [facility] to be done on if she wishes to be done before her appointment I could be happy to order the same      I discussed the assessment and treatment plan with the patient. The patient was provided an opportunity to ask questions and all were answered. The patient agreed with the plan and demonstrated an understanding of the instructions.   The patient was advised to call back or seek an in-person evaluation if the symptoms worsen or if the condition fails to improve as anticipated.  I provided 20 minutes of face-to-face time during this encounter.  Dr Annette Bellows MD,MRCP [facility]) Gastroenterology/Hepatology Pager:  [phone]   Speech recognition software was used to dictate this note.

## 2020-11-02 ENCOUNTER — Encounter (HOSPITAL_BASED_OUTPATIENT_CLINIC_OR_DEPARTMENT_OTHER): Payer: Self-pay | Admitting: General Surgery

## 2020-11-06 ENCOUNTER — Other Ambulatory Visit (HOSPITAL_COMMUNITY)
Admission: RE | Admit: 2020-11-06 | Discharge: 2020-11-06 | Disposition: A | Discharge status: Home / Self Care | Payer: Managed Care, Other (non HMO) | Source: Ambulatory Visit | Attending: General Surgery | Admitting: General Surgery

## 2020-11-06 ENCOUNTER — Other Ambulatory Visit: Payer: Self-pay

## 2020-11-06 DIAGNOSIS — Z20822 Contact with and (suspected) exposure to covid-19: Secondary | ICD-10-CM | POA: Insufficient documentation

## 2020-11-06 DIAGNOSIS — Z01812 Encounter for preprocedural laboratory examination: Secondary | ICD-10-CM | POA: Diagnosis present

## 2020-11-06 DIAGNOSIS — K6389 Other specified diseases of intestine: Secondary | ICD-10-CM

## 2020-11-06 LAB — SARS CORONAVIRUS 2 (TAT 6-24 HRS): SARS Coronavirus 2: NEGATIVE

## 2020-11-06 NOTE — Progress Notes (Signed)
Referral has been placed to [facility].

## 2020-11-08 ENCOUNTER — Ambulatory Visit
Admission: RE | Admit: 2020-11-08 | Discharge: 2020-11-08 | Disposition: A | Discharge status: Home / Self Care | Payer: Managed Care, Other (non HMO) | Source: Ambulatory Visit | Attending: General Surgery | Admitting: General Surgery

## 2020-11-08 ENCOUNTER — Other Ambulatory Visit: Payer: Self-pay

## 2020-11-08 DIAGNOSIS — N6489 Other specified disorders of breast: Secondary | ICD-10-CM

## 2020-11-08 NOTE — H&P (Signed)
   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 she had screening mm that shows b density breasts. she has a right breast mass possibly noted on screen. this is in outer right breast middle depth. additional images confirm the subtle distortion. US shows no correlate. right axilla is negative. she underwent core biopsy that shows csl with microcalcs and epithelial hyperplasia with apocrine metaplasia. was recomended for excision and presents today to discuss options she has unexpected weight loss and is currently undergoing gi evaluation with endoscopies scheduled for end of month..   Past Surgical History  Breast Biopsy  Right. Knee Surgery  Left. Oral Surgery   Diagnostic Studies History  Colonoscopy  5-10 years ago Mammogram  within last year Pap Smear  1-5 years ago  Allergies No Known Drug Allergies    Medication History  No Current Medications Medications Reconciled  Social History  Alcohol use  Occasional alcohol use. Caffeine use  Coffee. No drug use  Tobacco use  Never smoker.  Family History  Heart Disease  Father. Hypertension  Mother. Respiratory Condition  Mother.  Pregnancy / Birth History  Age at menarche  32 years. Age of menopause  37-50 Gravida  2 Length (months) of breastfeeding  >60 Maternal age  2-25 Para  2   Review of Systems  General Present- Weight Loss. Not Present- Appetite Loss, Chills, Fatigue, Fever, Night Sweats and Weight Gain. Skin Not Present- Change in Wart/Mole, Dryness, Hives, Jaundice, New Lesions, Non-Healing Wounds, Rash and Ulcer. HEENT Not Present- Earache, Hearing Loss, Hoarseness, Nose Bleed, Oral Ulcers, Ringing in the Ears, Seasonal Allergies, Sinus Pain, Sore Throat, Visual Disturbances, Wears glasses/contact lenses and Yellow Eyes. Respiratory Not Present- Bloody sputum, Chronic Cough, Difficulty Breathing, Snoring and  Wheezing. Breast Present- Breast Mass. Not Present- Breast Pain, Nipple Discharge and Skin Changes. Cardiovascular Not Present- Chest Pain, Difficulty Breathing Lying Down, Leg Cramps, Palpitations, Rapid Heart Rate, Shortness of Breath and Swelling of Extremities. Gastrointestinal Present- Bloating and Excessive gas. Not Present- Abdominal Pain, Bloody Stool, Change in Bowel Habits, Chronic diarrhea, Constipation, Difficulty Swallowing, Gets full quickly at meals, Hemorrhoids, Indigestion, Nausea, Rectal Pain and Vomiting. Female Genitourinary Not Present- Frequency, Nocturia, Painful Urination, Pelvic Pain and Urgency. Musculoskeletal Not Present- Back Pain, Joint Pain, Joint Stiffness, Muscle Pain, Muscle Weakness and Swelling of Extremities. Neurological Not Present- Decreased Memory, Fainting, Headaches, Numbness, Seizures, Tingling, Tremor, Trouble walking and Weakness. Psychiatric Not Present- Anxiety, Bipolar, Change in Sleep Pattern, Depression, Fearful and Frequent crying. Endocrine Not Present- Cold Intolerance, Excessive Hunger, Hair Changes, Heat Intolerance, Hot flashes and New Diabetes. Hematology Not Present- Blood Thinners, Easy Bruising, Excessive bleeding, Gland problems, HIV and Persistent Infections.   Physical Exam Rolm Bookbinder MD; 09/28/2020 9:38 AM) General Mental Status-Alert. Orientation-Oriented X3. Breast Nipples-No Discharge. Breast Lump-No Palpable Breast Mass. Lymphatic Head & Neck General Head & Neck Lymphatics: Bilateral - Description - Normal. Axillary General Axillary Region: Bilateral - Description - Normal. Note: no Henlawson adenopathy  Assessment & Plan  COMPLEX SCLEROSING LESION OF RIGHT BREAST (N64.89) Story: Right breast seed guided excisional biopsy I think proceeding with GI evaluation first and then doing this is reasonable. I discussed finding at biopsy and upgrade rate of about 15-18% locally. I think reasonable to excise this area as  does pathology. discussed surgery, recovery and she will call back to schedule

## 2020-11-09 ENCOUNTER — Ambulatory Visit (HOSPITAL_BASED_OUTPATIENT_CLINIC_OR_DEPARTMENT_OTHER): Payer: Managed Care, Other (non HMO) | Admitting: Anesthesiology

## 2020-11-09 ENCOUNTER — Encounter (HOSPITAL_BASED_OUTPATIENT_CLINIC_OR_DEPARTMENT_OTHER): Payer: Self-pay | Admitting: General Surgery

## 2020-11-09 ENCOUNTER — Ambulatory Visit (HOSPITAL_BASED_OUTPATIENT_CLINIC_OR_DEPARTMENT_OTHER)
Admission: RE | Admit: 2020-11-09 | Discharge: 2020-11-09 | Disposition: A | Discharge status: Home / Self Care | Payer: Managed Care, Other (non HMO) | Attending: General Surgery | Admitting: General Surgery

## 2020-11-09 ENCOUNTER — Other Ambulatory Visit: Payer: Self-pay

## 2020-11-09 ENCOUNTER — Encounter (HOSPITAL_BASED_OUTPATIENT_CLINIC_OR_DEPARTMENT_OTHER)
Admission: RE | Disposition: A | Discharge status: Home / Self Care | Payer: Self-pay | Source: Home / Self Care | Attending: General Surgery

## 2020-11-09 ENCOUNTER — Ambulatory Visit
Admission: RE | Admit: 2020-11-09 | Discharge: 2020-11-09 | Disposition: A | Discharge status: Home / Self Care | Payer: Managed Care, Other (non HMO) | Source: Ambulatory Visit | Attending: General Surgery | Admitting: General Surgery

## 2020-11-09 DIAGNOSIS — N6489 Other specified disorders of breast: Secondary | ICD-10-CM

## 2020-11-09 DIAGNOSIS — N6081 Other benign mammary dysplasias of right breast: Secondary | ICD-10-CM | POA: Diagnosis not present

## 2020-11-09 DIAGNOSIS — N649 Disorder of breast, unspecified: Secondary | ICD-10-CM | POA: Diagnosis present

## 2020-11-09 HISTORY — PX: RADIOACTIVE SEED GUIDED EXCISIONAL BREAST BIOPSY: SHX6490

## 2020-11-09 SURGERY — RADIOACTIVE SEED GUIDED BREAST BIOPSY
Anesthesia: General | Site: Breast | Laterality: Right

## 2020-11-09 MED ORDER — KETOROLAC TROMETHAMINE 15 MG/ML IJ SOLN
15.0000 mg | INTRAMUSCULAR | Status: AC
  Administered 2020-11-09: 15 mg via INTRAVENOUS

## 2020-11-09 MED ORDER — MEPERIDINE HCL 25 MG/ML IJ SOLN: 6.2500 mg | INTRAMUSCULAR | Status: DC | PRN

## 2020-11-09 MED ORDER — LIDOCAINE 2% (20 MG/ML) 5 ML SYRINGE
INTRAMUSCULAR | Status: AC
  Filled 2020-11-09: qty 5

## 2020-11-09 MED ORDER — LIDOCAINE 2% (20 MG/ML) 5 ML SYRINGE
INTRAMUSCULAR | Status: DC | PRN
  Administered 2020-11-09: 60 mg via INTRAVENOUS

## 2020-11-09 MED ORDER — 0.9 % SODIUM CHLORIDE (POUR BTL) OPTIME
TOPICAL | Status: DC | PRN
  Administered 2020-11-09: 1000 mL

## 2020-11-09 MED ORDER — ENSURE PRE-SURGERY PO LIQD: 296.0000 mL | Freq: Once | ORAL | Status: DC

## 2020-11-09 MED ORDER — ACETAMINOPHEN 500 MG PO TABS
ORAL_TABLET | ORAL | Status: AC
  Filled 2020-11-09: qty 2

## 2020-11-09 MED ORDER — OXYCODONE HCL 5 MG PO TABS: 5.0000 mg | ORAL_TABLET | Freq: Once | ORAL | Status: DC | PRN

## 2020-11-09 MED ORDER — LACTATED RINGERS IV SOLN: INTRAVENOUS | Status: DC

## 2020-11-09 MED ORDER — FENTANYL CITRATE (PF) 100 MCG/2ML IJ SOLN
INTRAMUSCULAR | Status: AC
  Filled 2020-11-09: qty 2

## 2020-11-09 MED ORDER — CEFAZOLIN SODIUM-DEXTROSE 2-4 GM/100ML-% IV SOLN
2.0000 g | INTRAVENOUS | Status: AC
  Administered 2020-11-09: 2 g via INTRAVENOUS

## 2020-11-09 MED ORDER — MIDAZOLAM HCL 5 MG/5ML IJ SOLN
INTRAMUSCULAR | Status: DC | PRN
  Administered 2020-11-09: 2 mg via INTRAVENOUS

## 2020-11-09 MED ORDER — PROPOFOL 10 MG/ML IV BOLUS
INTRAVENOUS | Status: DC | PRN
  Administered 2020-11-09: 150 mg via INTRAVENOUS

## 2020-11-09 MED ORDER — PROMETHAZINE HCL 25 MG/ML IJ SOLN: 6.2500 mg | INTRAMUSCULAR | Status: DC | PRN

## 2020-11-09 MED ORDER — AMISULPRIDE (ANTIEMETIC) 5 MG/2ML IV SOLN: 10.0000 mg | Freq: Once | INTRAVENOUS | Status: DC | PRN

## 2020-11-09 MED ORDER — FENTANYL CITRATE (PF) 100 MCG/2ML IJ SOLN
INTRAMUSCULAR | Status: DC | PRN
  Administered 2020-11-09 (×2): 50 ug via INTRAVENOUS

## 2020-11-09 MED ORDER — CEFAZOLIN SODIUM-DEXTROSE 2-4 GM/100ML-% IV SOLN
INTRAVENOUS | Status: AC
  Filled 2020-11-09: qty 100

## 2020-11-09 MED ORDER — DEXAMETHASONE SODIUM PHOSPHATE 10 MG/ML IJ SOLN
INTRAMUSCULAR | Status: DC | PRN
  Administered 2020-11-09: 10 mg via INTRAVENOUS

## 2020-11-09 MED ORDER — KETOROLAC TROMETHAMINE 15 MG/ML IJ SOLN
INTRAMUSCULAR | Status: AC
  Filled 2020-11-09: qty 1

## 2020-11-09 MED ORDER — HYDROMORPHONE HCL 1 MG/ML IJ SOLN: 0.2500 mg | INTRAMUSCULAR | Status: DC | PRN

## 2020-11-09 MED ORDER — ACETAMINOPHEN 500 MG PO TABS
1000.0000 mg | ORAL_TABLET | ORAL | Status: AC
  Administered 2020-11-09: 1000 mg via ORAL

## 2020-11-09 MED ORDER — EPHEDRINE SULFATE-NACL 50-0.9 MG/10ML-% IV SOSY
PREFILLED_SYRINGE | INTRAVENOUS | Status: DC | PRN
  Administered 2020-11-09: 10 mg via INTRAVENOUS

## 2020-11-09 MED ORDER — BUPIVACAINE HCL (PF) 0.25 % IJ SOLN
INTRAMUSCULAR | Status: DC | PRN
  Administered 2020-11-09: 10 mL

## 2020-11-09 MED ORDER — ONDANSETRON HCL 4 MG/2ML IJ SOLN
INTRAMUSCULAR | Status: DC | PRN
  Administered 2020-11-09: 4 mg via INTRAVENOUS

## 2020-11-09 MED ORDER — EPHEDRINE 5 MG/ML INJ
INTRAVENOUS | Status: AC
  Filled 2020-11-09: qty 10

## 2020-11-09 MED ORDER — OXYCODONE HCL 5 MG/5ML PO SOLN: 5.0000 mg | Freq: Once | ORAL | Status: DC | PRN

## 2020-11-09 MED ORDER — MIDAZOLAM HCL 2 MG/2ML IJ SOLN
INTRAMUSCULAR | Status: AC
  Filled 2020-11-09: qty 2

## 2020-11-09 MED ORDER — ONDANSETRON HCL 4 MG/2ML IJ SOLN
INTRAMUSCULAR | Status: AC
  Filled 2020-11-09: qty 2

## 2020-11-09 SURGICAL SUPPLY — 52 items
APPLIER CLIP 9.375 MED OPEN (MISCELLANEOUS)
BINDER BREAST LRG (GAUZE/BANDAGES/DRESSINGS) ×2 IMPLANT
BINDER BREAST MEDIUM (GAUZE/BANDAGES/DRESSINGS) IMPLANT
BINDER BREAST XLRG (GAUZE/BANDAGES/DRESSINGS) IMPLANT
BINDER BREAST XXLRG (GAUZE/BANDAGES/DRESSINGS) IMPLANT
BLADE SURG 15 STRL LF DISP TIS (BLADE) ×1 IMPLANT
BLADE SURG 15 STRL SS (BLADE) ×2
CANISTER SUC SOCK COL 7IN (MISCELLANEOUS) IMPLANT
CANISTER SUCT 1200ML W/VALVE (MISCELLANEOUS) IMPLANT
CHLORAPREP W/TINT 26 (MISCELLANEOUS) ×2 IMPLANT
CLIP APPLIE 9.375 MED OPEN (MISCELLANEOUS) IMPLANT
CLIP VESOCCLUDE SM WIDE 6/CT (CLIP) IMPLANT
COVER BACK TABLE 60X90IN (DRAPES) ×2 IMPLANT
COVER MAYO STAND STRL (DRAPES) ×2 IMPLANT
COVER PROBE W GEL 5X96 (DRAPES) ×2 IMPLANT
COVER WAND RF STERILE (DRAPES) IMPLANT
DECANTER SPIKE VIAL GLASS SM (MISCELLANEOUS) IMPLANT
DERMABOND ADVANCED (GAUZE/BANDAGES/DRESSINGS) ×1
DERMABOND ADVANCED .7 DNX12 (GAUZE/BANDAGES/DRESSINGS) ×1 IMPLANT
DRAPE LAPAROSCOPIC ABDOMINAL (DRAPES) ×2 IMPLANT
DRAPE UTILITY XL STRL (DRAPES) ×2 IMPLANT
DRSG TEGADERM 4X4.75 (GAUZE/BANDAGES/DRESSINGS) IMPLANT
ELECT COATED BLADE 2.86 ST (ELECTRODE) ×2 IMPLANT
ELECT REM PT RETURN 9FT ADLT (ELECTROSURGICAL) ×2
ELECTRODE REM PT RTRN 9FT ADLT (ELECTROSURGICAL) ×1 IMPLANT
GAUZE SPONGE 4X4 12PLY STRL LF (GAUZE/BANDAGES/DRESSINGS) IMPLANT
GLOVE SURG ENC MOIS LTX SZ7 (GLOVE) ×4 IMPLANT
GLOVE SURG UNDER POLY LF SZ7.5 (GLOVE) ×2 IMPLANT
GOWN STRL REUS W/ TWL LRG LVL3 (GOWN DISPOSABLE) ×2 IMPLANT
GOWN STRL REUS W/TWL LRG LVL3 (GOWN DISPOSABLE) ×4
HEMOSTAT ARISTA ABSORB 3G PWDR (HEMOSTASIS) IMPLANT
KIT MARKER MARGIN INK (KITS) ×2 IMPLANT
NEEDLE HYPO 25X1 1.5 SAFETY (NEEDLE) ×2 IMPLANT
NS IRRIG 1000ML POUR BTL (IV SOLUTION) IMPLANT
PACK BASIN DAY SURGERY FS (CUSTOM PROCEDURE TRAY) ×2 IMPLANT
PENCIL SMOKE EVACUATOR (MISCELLANEOUS) ×2 IMPLANT
RETRACTOR ONETRAX LX 90X20 (MISCELLANEOUS) IMPLANT
SLEEVE SCD COMPRESS KNEE MED (MISCELLANEOUS) ×2 IMPLANT
SPONGE LAP 4X18 RFD (DISPOSABLE) ×2 IMPLANT
STRIP CLOSURE SKIN 1/2X4 (GAUZE/BANDAGES/DRESSINGS) ×2 IMPLANT
SUT MNCRL AB 4-0 PS2 18 (SUTURE) IMPLANT
SUT MON AB 5-0 PS2 18 (SUTURE) ×2 IMPLANT
SUT SILK 2 0 SH (SUTURE) IMPLANT
SUT VIC AB 2-0 SH 27 (SUTURE) ×4
SUT VIC AB 2-0 SH 27XBRD (SUTURE) ×2 IMPLANT
SUT VIC AB 3-0 SH 27 (SUTURE) ×2
SUT VIC AB 3-0 SH 27X BRD (SUTURE) ×1 IMPLANT
SYR CONTROL 10ML LL (SYRINGE) ×2 IMPLANT
TOWEL GREEN STERILE FF (TOWEL DISPOSABLE) ×2 IMPLANT
TRAY FAXITRON CT DISP (TRAY / TRAY PROCEDURE) ×2 IMPLANT
TUBE CONNECTING 20X1/4 (TUBING) IMPLANT
YANKAUER SUCT BULB TIP NO VENT (SUCTIONS) IMPLANT

## 2020-11-09 NOTE — Anesthesia Preprocedure Evaluation (Signed)
Anesthesia Evaluation  Patient identified by MRN, date of birth, ID band Patient awake    Reviewed: Allergy & Precautions, H&P , NPO status , Patient's Chart, lab work & pertinent test results  Airway Mallampati: II  TM Distance: >3 FB Neck ROM: Full    Dental no notable dental hx.    Pulmonary neg pulmonary ROS,    Pulmonary exam normal breath sounds clear to auscultation       Cardiovascular negative cardio ROS Normal cardiovascular exam Rhythm:Regular Rate:Normal     Neuro/Psych negative neurological ROS  negative psych ROS   GI/Hepatic Neg liver ROS, GERD  ,  Endo/Other  negative endocrine ROS  Renal/GU negative Renal ROS  negative genitourinary   Musculoskeletal negative musculoskeletal ROS (+)   Abdominal   Peds negative pediatric ROS (+)  Hematology negative hematology ROS (+)   Anesthesia Other Findings   Reproductive/Obstetrics negative OB ROS                             Anesthesia Physical Anesthesia Plan  ASA: II  Anesthesia Plan: General   Post-op Pain Management:    Induction: Intravenous  PONV Risk Score and Plan: 3 and Ondansetron, Dexamethasone, Midazolam and Treatment may vary due to age or medical condition  Airway Management Planned: LMA  Additional Equipment:   Intra-op Plan:   Post-operative Plan: Extubation in OR  Informed Consent: I have reviewed the patients History and Physical, chart, labs and discussed the procedure including the risks, benefits and alternatives for the proposed anesthesia with the patient or authorized representative who has indicated his/her understanding and acceptance.     Dental advisory given  Plan Discussed with: CRNA  Anesthesia Plan Comments:         Anesthesia Quick Evaluation

## 2020-11-09 NOTE — Anesthesia Procedure Notes (Signed)
Procedure Name: LMA Insertion Date/Time: 11/09/2020 12:05 PM Performed by: Genelle Bal, CRNA Pre-anesthesia Checklist: Patient identified, Emergency Drugs available, Suction available and Patient being monitored Patient Re-evaluated:Patient Re-evaluated prior to induction Oxygen Delivery Method: Circle system utilized Preoxygenation: Pre-oxygenation with 100% oxygen Induction Type: IV induction Ventilation: Mask ventilation without difficulty LMA: LMA inserted LMA Size: 4.0 Number of attempts: 1 Airway Equipment and Method: Bite block Placement Confirmation: positive ETCO2 Tube secured with: Tape Dental Injury: Teeth and Oropharynx as per pre-operative assessment

## 2020-11-09 NOTE — Op Note (Signed)
Preoperative diagnosis: Right breast mammographic distortion Postoperative diagnosis: Same as above Procedure: Right breast radioactive seed guided excisional biopsy Surgeon: Dr. 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 she had screening mm that shows b density breasts. she has a right breast mass possibly noted on screen. this is in outer right breast middle depth. additional images confirm the subtle distortion. US shows no correlate. right axilla is negative. she underwent core biopsy that shows csl with microcalcs and epithelial hyperplasia with apocrine metaplasia. We discussed excision..  Procedure: After informed consent was obtained the patient first had a radioactive seed placed. I had these mammograms in the operating room. She was given antibiotics. SCDs were in place. She was then placed under general anesthesia without complication. She was prepped and draped in the standard sterile surgical fashion. Surgical timeout was then performed.  Iinfiltrated marcaine around the areola and the seed in thelateral right breast.I then made a periareolar incision to hide the scar later.I removedthe seed and some of the surrounding tissue.I then marked this with paint. I then did a mammogram confirming removal of the seed and the clip. Hemostasis was then obtained. I closed the breast tissue with 2-0 Vicryl. The skin was closed with 3-0 Vicryl for Monocryl. Glue and Steri-Strips were applied. She tolerated this well was extubated transferred to recovery in stable condition.

## 2020-11-09 NOTE — Anesthesia Postprocedure Evaluation (Signed)
Anesthesia Post Note  Patient: [place]  Procedure(s) Performed: RADIOACTIVE SEE GUIDED RIGHT BREAST LUMPECTOMY (Right Breast)     Patient location during evaluation: PACU Anesthesia Type: General Level of consciousness: awake and alert Pain management: pain level controlled Vital Signs Assessment: post-procedure vital signs reviewed and stable Respiratory status: spontaneous breathing, nonlabored ventilation and respiratory function stable Cardiovascular status: blood pressure returned to baseline and stable Postop Assessment: no apparent nausea or vomiting Anesthetic complications: no   No complications documented.  Last Vitals:  Vitals:   11/09/20 1315 11/09/20 1332  BP: 129/74 116/78  Pulse: (!) 52 (!) 58  Resp: 19 18  Temp:  36.5 C  SpO2: 99% 99%    Last Pain:  Vitals:   11/09/20 1332  TempSrc:   PainSc: 0-No pain                 Lynda Rainwater

## 2020-11-09 NOTE — Interval H&P Note (Signed)
History and Physical Interval Note:  11/09/2020 11:32 AM  [place]  has presented today for surgery, with the diagnosis of RIGHT BREAST MAMMOGRAPHIC DISTURBER.  The various methods of treatment have been discussed with the patient and family. After consideration of risks, benefits and other options for treatment, the patient has consented to  Procedure(s) with comments: RIGHT BREAST SEED GUIDED EXCISIONAL BIOPSY (Right) - LMA as a surgical intervention.  The patient's history has been reviewed, patient examined, no change in status, stable for surgery.  I have reviewed the patient's chart and labs.  Questions were answered to the patient's satisfaction.     Rolm Bookbinder

## 2020-11-09 NOTE — Transfer of Care (Signed)
Immediate Anesthesia Transfer of Care Note  Patient: [place]  Procedure(s) Performed: RADIOACTIVE SEE GUIDED RIGHT BREAST LUMPECTOMY (Right Breast)  Patient Location: PACU  Anesthesia Type:General  Level of Consciousness: awake, alert  and oriented  Airway & Oxygen Therapy: Patient Spontanous Breathing and Patient connected to face mask oxygen  Post-op Assessment: Report given to RN and Post -op Vital signs reviewed and stable  Post vital signs: Reviewed and stable  Last Vitals:  Vitals Value Taken Time  BP 105/72   Temp    Pulse 58 11/09/20 1248  Resp 13 11/09/20 1248  SpO2 100 % 11/09/20 1248  Vitals shown include unvalidated device data.  Last Pain:  Vitals:   11/09/20 1029  TempSrc: Oral  PainSc: 0-No pain         Complications: No complications documented.

## 2020-11-09 NOTE — Discharge Instructions (Signed)
[facility] Office Phone Number [phone]  BREAST BIOPSY/ PARTIAL MASTECTOMY: POST OP INSTRUCTIONS Take 400 mg of ibuprofen every 8 hours or 650 mg tylenol every 6 hours for next 72 hours then as needed. Use ice several times daily also. Always review your discharge instruction sheet given to you by the facility where your surgery was performed.  IF YOU HAVE DISABILITY OR FAMILY LEAVE FORMS, YOU MUST BRING THEM TO THE OFFICE FOR PROCESSING.  DO NOT GIVE THEM TO YOUR DOCTOR.  1. A prescription for pain medication may be given to you upon discharge.  Take your pain medication as prescribed, if needed.  If narcotic pain medicine is not needed, then you may take acetaminophen (Tylenol), naprosyn (Alleve) or ibuprofen (Advil) as needed. 2. Take your usually prescribed medications unless otherwise directed 3. If you need a refill on your pain medication, please contact your pharmacy.  They will contact our office to request authorization.  Prescriptions will not be filled after 5pm or on week-ends. 4. You should eat very light the first 24 hours after surgery, such as soup, crackers, pudding, etc.  Resume your normal diet the day after surgery. 5. Most patients will experience some swelling and bruising in the breast.  Ice packs and a good support bra will help.  Wear the breast binder provided or a sports bra for 72 hours day and night.  After that wear a sports bra during the day until you return to the office. Swelling and bruising can take several days to resolve.  6. It is common to experience some constipation if taking pain medication after surgery.  Increasing fluid intake and taking a stool softener will usually help or prevent this problem from occurring.  A mild laxative (Milk of Magnesia or Miralax) should be taken according to package directions if there are no bowel movements after 48 hours. 7. Unless discharge instructions indicate otherwise, you may remove your bandages 48  hours after surgery and you may shower at that time.  You may have steri-strips (small skin tapes) in place directly over the incision.  These strips should be left on the skin for 7-10 days and will come off on their own.  If your surgeon used skin glue on the incision, you may shower in 24 hours.  The glue will flake off over the next 2-3 weeks.  Any sutures or staples will be removed at the office during your follow-up visit. 8. ACTIVITIES:  You may resume regular daily activities (gradually increasing) beginning the next day.  Wearing a good support bra or sports bra minimizes pain and swelling.  You may have sexual intercourse when it is comfortable. a. You may drive when you no longer are taking prescription pain medication, you can comfortably wear a seatbelt, and you can safely maneuver your car and apply brakes. b. RETURN TO WORK:  ______________________________________________________________________________________ 9. You should see your doctor in the office for a follow-up appointment approximately two weeks after your surgery.  Your doctor's nurse will typically make your follow-up appointment when she calls you with your pathology report.  Expect your pathology report 3-4 business days after your surgery.  You may call to check if you do not hear from us after three days. 10. OTHER INSTRUCTIONS: _______________________________________________________________________________________________ _____________________________________________________________________________________________________________________________________ _____________________________________________________________________________________________________________________________________ _____________________________________________________________________________________________________________________________________  WHEN TO CALL DR WAKEFIELD: 1. Fever over 101.0 2. Nausea and/or vomiting. 3. Extreme swelling or  bruising. 4. Continued bleeding from incision. 5. Increased pain, redness, or drainage from the incision.  The clinic   staff is available to answer your questions during regular business hours.  Please don't hesitate to call and ask to speak to one of the nurses for clinical concerns.  If you have a medical emergency, go to the nearest emergency room or call 911.  A surgeon from [facility] is always on call at the hospital.  For further questions, please visit [email or web address]   No Tylenol before 6:30pm if needed. No ibuprofen before 4:30pm if needed.   Post Anesthesia Home Care Instructions  Activity: Get plenty of rest for the remainder of the day. A responsible individual must stay with you for 24 hours following the procedure.  For the next 24 hours, DO NOT: -Drive a car -Paediatric nurse -Drink alcoholic beverages -Take any medication unless instructed by your physician -Make any legal decisions or sign important papers.  Meals: Start with liquid foods such as gelatin or soup. Progress to regular foods as tolerated. Avoid greasy, spicy, heavy foods. If nausea and/or vomiting occur, drink only clear liquids until the nausea and/or vomiting subsides. Call your physician if vomiting continues.  Special Instructions/Symptoms: Your throat may feel dry or sore from the anesthesia or the breathing tube placed in your throat during surgery. If this causes discomfort, gargle with warm salt water. The discomfort should disappear within 24 hours.  If you had a scopolamine patch placed behind your ear for the management of post- operative nausea and/or vomiting:  1. The medication in the patch is effective for 72 hours, after which it should be removed.  Wrap patch in a tissue and discard in the trash. Wash hands thoroughly with soap and water. 2. You may remove the patch earlier than 72 hours if you experience unpleasant side effects which may include dry mouth,  dizziness or visual disturbances. 3. Avoid touching the patch. Wash your hands with soap and water after contact with the patch.

## 2020-11-12 ENCOUNTER — Encounter (HOSPITAL_BASED_OUTPATIENT_CLINIC_OR_DEPARTMENT_OTHER): Payer: Self-pay | Admitting: General Surgery

## 2020-11-12 LAB — SURGICAL PATHOLOGY

## 2020-11-19 NOTE — H&P (Signed)
Annette Marks is an 60 y.o. female.   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 she had screening mm that shows b density breasts. she has a right breast mass possibly noted on screen. this is in outer right breast middle depth. additional images confirm the subtle distortion. US shows no correlate. right axilla is negative. she underwent core biopsy that shows csl with microcalcs and epithelial hyperplasia with apocrine metaplasia. was recomended for excision    Past Medical History:  Diagnosis Date  . H/O mumps   . H/O varicella   . Urethrocele, female    h/o normal cystoscopy (Dr. Serita Butcher)  . Vitamin D deficiency 2010   23 by Dr. Theda Sers, normal after taking supplements (recurrent in 2016)    Past Surgical History:  Procedure Laterality Date  . COLONOSCOPY WITH PROPOFOL N/A 10/15/2020   Procedure: COLONOSCOPY WITH PROPOFOL;  Surgeon: [name], MD;  Location: [facility] ENDOSCOPY;  Service: Gastroenterology;  Laterality: N/A;  . ESOPHAGOGASTRODUODENOSCOPY (EGD) WITH PROPOFOL N/A 10/15/2020   Procedure: ESOPHAGOGASTRODUODENOSCOPY (EGD) WITH PROPOFOL;  Surgeon: [name], MD;  Location: [facility] ENDOSCOPY;  Service: Gastroenterology;  Laterality: N/A;  . [place]   left--torn ligaments/acl  . MOHS SURGERY  2016, 09/2017   forehead, left temple (Dr. Sarajane Jews)  . needle core biopsy Right 09/20/2020   Right breast;within the complex sclerosing lesion, there is epithelial hyperplasia and apocrine metaplasia;excisional biopsy is recommended  . RADIOACTIVE SEED GUIDED EXCISIONAL BREAST BIOPSY Right 11/09/2020   Procedure: RADIOACTIVE SEE GUIDED RIGHT BREAST LUMPECTOMY;  Surgeon: Rolm Bookbinder, MD;  Location: [place];  Service: General;  Laterality: Right;  . TUBAL LIGATION    . WISDOM TOOTH EXTRACTION  1979    Family History  Problem Relation Age  of Onset  . Hyperlipidemia Father   . Hypertension Father   . Congestive Heart Failure Father   . Immunodeficiency Mother        thrombocythemia  . Arthritis Mother        joint pains; on methotrexate, sees rheum; unspec connective tissue dz  . Sjogren's syndrome Mother   . Osteoporosis Mother   . Raynaud syndrome Mother   . Hypertension Mother   . Neuropathy Mother   . Depression Sister   . Anxiety disorder Sister   . Hypothyroidism Daughter   . Hypothyroidism Paternal Grandmother   . Diabetes Neg Hx   . Cancer Neg Hx    Social History:  reports that she has never smoked. She has never used smokeless tobacco. She reports previous alcohol use. She reports that she does not use drugs.  Allergies:  Allergies  Allergen Reactions  . Carbohydrate     intolerance  . Xifaxan [Rifaximin] Other (See Comments)    Joint pains (hips)    No medications prior to admission.    No results found for this or any previous visit (from the past 48 hour(s)). No results found.  Review of Systems General Present- Weight Loss. Not Present- Appetite Loss, Chills, Fatigue, Fever, Night Sweats and Weight Gain. Skin Not Present- Change in Wart/Mole, Dryness, Hives, Jaundice, New Lesions, Non-Healing Wounds, Rash and Ulcer. HEENT Not Present- Earache, Hearing Loss, Hoarseness, Nose Bleed, Oral Ulcers, Ringing in the Ears, Seasonal Allergies, Sinus Pain, Sore Throat, Visual Disturbances, Wears glasses/contact lenses and Yellow Eyes. Respiratory Not Present- Bloody sputum, Chronic Cough, Difficulty Breathing, Snoring and  Wheezing. Breast Present- Breast Mass. Not Present- Breast Pain, Nipple Discharge and Skin Changes. Cardiovascular Not Present- Chest Pain, Difficulty Breathing Lying Down, Leg Cramps, Palpitations, Rapid Heart Rate, Shortness of Breath and Swelling of Extremities. Gastrointestinal Present- Bloating and Excessive gas. Not Present- Abdominal Pain, Bloody Stool, Change in Bowel Habits,  Chronic diarrhea, Constipation, Difficulty Swallowing, Gets full quickly at meals, Hemorrhoids, Indigestion, Nausea, Rectal Pain and Vomiting. Female Genitourinary Not Present- Frequency, Nocturia, Painful Urination, Pelvic Pain and Urgency. Musculoskeletal Not Present- Back Pain, Joint Pain, Joint Stiffness, Muscle Pain, Muscle Weakness and Swelling of Extremities. Neurological Not Present- Decreased Memory, Fainting, Headaches, Numbness, Seizures, Tingling, Tremor, Trouble walking and Weakness. Psychiatric Not Present- Anxiety, Bipolar, Change in Sleep Pattern, Depression, Fearful and Frequent crying. Endocrine Not Present- Cold Intolerance, Excessive Hunger, Hair Changes, Heat Intolerance, Hot flashes and New Diabetes. Hematology Not Present- Blood Thinners, Easy Bruising, Excessive bleeding, Gland problems, HIV and Persistent Infections.   Blood pressure 116/78, pulse (!) 58, temperature 97.7 F (36.5 C), resp. rate 18, height 5\' 8"  (1.727 m), weight 66.9 kg, last menstrual period 09/06/2011, SpO2 99 %. Physical Exam  General Mental Status-Alert. Orientation-Oriented X3. Breast Nipples-No Discharge. Breast Lump-No Palpable Breast Mass. Lymphatic Head & Neck General Head & Neck Lymphatics: Bilateral - Description - Normal. Axillary General Axillary Region: Bilateral - Description - Normal. 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 I think reasonable to excise this area as does pathology. discussed surgery, recovery  Rolm Bookbinder, MD 11/19/2020, 9:55 AM

## 2020-11-21 ENCOUNTER — Telehealth: Payer: Self-pay | Admitting: Gastroenterology

## 2020-11-21 NOTE — Telephone Encounter (Signed)
Patient called and wants information on her referral to [facility], please call to advise

## 2020-11-22 ENCOUNTER — Telehealth: Payer: Self-pay | Admitting: Gastroenterology

## 2020-11-22 NOTE — Telephone Encounter (Signed)
Spoke with pt and informed her that we've resubmitted the [facility] referral as they did not receive the initial referral.  Will ask another provider for advice as Dr. Vicente Males is out of the office.

## 2020-11-22 NOTE — Telephone Encounter (Signed)
Patient calling for symptoms of bloating and terrible gas. What can she do?  Also, checking on the [facility] referral. Patient states she called and they stated that the referral is not in. [facility] Fax # [phone]

## 2020-12-13 ENCOUNTER — Telehealth: Payer: Self-pay | Admitting: Gastroenterology

## 2020-12-13 NOTE — Telephone Encounter (Signed)
Patient called states that she has referral into [facility] through Dr Vicente Males, they cannot see her for 2 months.  Patient is having day 2 of diarrhea, belching continues even thought she is following diet given to her.  Patient asks for possible antibiotic.  Please call to advise.

## 2020-12-14 ENCOUNTER — Other Ambulatory Visit: Payer: Self-pay

## 2020-12-14 MED ORDER — DOXYCYCLINE HYCLATE 100 MG PO CAPS: 100.0000 mg | ORAL_CAPSULE | Freq: Two times a day (BID) | ORAL | 0 refills | Status: AC

## 2020-12-14 NOTE — Telephone Encounter (Signed)
Commence on Doxycycline 100 mg BID for 14 days . Inform her to let us know by Monday if it has not helped at all .    Dr [name] MD,MRCP [facility]) Gastroenterology/Hepatology Pager: [phone]

## 2020-12-14 NOTE — Telephone Encounter (Signed)
Prescription for Doxycycline 100mg  has been sent to [facility], [place] per Dr. Vicente Males. Pt notified.

## 2020-12-19 ENCOUNTER — Other Ambulatory Visit: Payer: Self-pay

## 2020-12-19 DIAGNOSIS — R1013 Epigastric pain: Secondary | ICD-10-CM

## 2020-12-19 DIAGNOSIS — R630 Anorexia: Secondary | ICD-10-CM

## 2021-01-07 ENCOUNTER — Ambulatory Visit: Payer: Managed Care, Other (non HMO)

## 2021-01-11 ENCOUNTER — Ambulatory Visit
Admission: RE | Admit: 2021-01-11 | Discharge: 2021-01-11 | Disposition: A | Discharge status: Home / Self Care | Payer: Managed Care, Other (non HMO) | Source: Ambulatory Visit | Attending: Gastroenterology | Admitting: Gastroenterology

## 2021-01-11 ENCOUNTER — Telehealth: Payer: Self-pay | Admitting: Gastroenterology

## 2021-01-11 ENCOUNTER — Other Ambulatory Visit: Payer: Self-pay | Admitting: Gastroenterology

## 2021-01-11 ENCOUNTER — Other Ambulatory Visit: Payer: Self-pay

## 2021-01-11 DIAGNOSIS — R630 Anorexia: Secondary | ICD-10-CM | POA: Insufficient documentation

## 2021-01-11 DIAGNOSIS — R1013 Epigastric pain: Secondary | ICD-10-CM | POA: Diagnosis not present

## 2021-01-11 LAB — POCT I-STAT CREATININE: Creatinine, Ser: 0.9 mg/dL (ref 0.44–1.00)

## 2021-01-11 MED ORDER — IOHEXOL 300 MG/ML  SOLN
85.0000 mL | Freq: Once | INTRAMUSCULAR | Status: AC | PRN
  Administered 2021-01-11: 85 mL via INTRAVENOUS

## 2021-01-11 NOTE — Telephone Encounter (Signed)
Patient LVM that she was returning your call, please call patient

## 2021-01-11 NOTE — Telephone Encounter (Signed)
-----   Message from [name], MD sent at 01/11/2021  9:27 AM EDT -----  Please inform CT scan is normal , can we schedule telephone visit on Monday - so I can see if we can help her any more before she is seen by [facility] which I think she has an appointment

## 2021-01-11 NOTE — Telephone Encounter (Signed)
Returned patients call. Informed patient of Dr. Georgeann Oppenheim comment regarding CT scan. Asked patient if she would like to make a telephone appt to speak with him, she declined at this time. Pt plans to reach out to [facility] again regarding referral. Pt verbalized understanding.

## 2021-01-20 DIAGNOSIS — U071 COVID-19: Secondary | ICD-10-CM

## 2021-01-20 HISTORY — DX: COVID-19: U07.1

## 2021-01-30 ENCOUNTER — Telehealth: Payer: Managed Care, Other (non HMO) | Admitting: Family Medicine

## 2021-01-30 ENCOUNTER — Encounter: Payer: Self-pay | Admitting: Family Medicine

## 2021-01-30 VITALS — Ht 68.0 in | Wt 140.0 lb

## 2021-01-30 DIAGNOSIS — U071 COVID-19: Secondary | ICD-10-CM

## 2021-01-30 NOTE — Patient Instructions (Signed)
Stay well hydrated, rest. Use medications to treat runny nose and congestion (decongestants with or without antihistamines as well) Mucinex-DM 12 hour twice daily will be helpful if/when mucus is thick or cough worsens, or sinus pain.  Contact us if you need any additional cough medication (we discussed benzonatate and hydrocodone-containing syrups, depending on if medication is needed during the day vs just at night).  Seek re-evaluation if you have persistent or worsening symptoms, especially if any chest pain, shortness of breath or other new, concerning symptoms.  We discussed getting a COVID booster, but can wait at least a month from this infection.

## 2021-01-30 NOTE — Progress Notes (Addendum)
Start time: 8:24 End time: 8:43  Virtual Visit via Video Note  I connected with [place] on 01/30/21 by a video enabled telemedicine application and verified that I am speaking with the correct person using two identifiers.  Location: Patient: home Provider: office   I discussed the limitations of evaluation and management by telemedicine and the availability of in person appointments. The patient expressed understanding and agreed to proceed.  History of Present Illness:  Chief Complaint  Patient presents with  . Covid Positive    VIRTUAL positive covid-yesterday. Symptoms started Monday, felt tired. Tuesday began feeling worse HA, ST and muscle aches. Now has cough and runny nose.    Fatigue started Monday, Tuesday morning awoke feeling like she was hit by a train.  Two + home tests for COVID.  [place], sore throat, fever.  Sore throat is improving, has slight cough, a lot of nasal drainage, which is clear.   Headache is at the top of the head, like she had with her vaccine. She denies chest pain, shortness of breath, change in bowels, rash.  +sick contacts at work (+students, pt is masked).  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  Hasn't had COVID booster due to breast issues/biopsy.  PMH, PSH, SH reviewed. 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  Outpatient Encounter Medications as of 01/30/2021  Medication Sig Note  . Charcoal Activated (ACTIVATED CHARCOAL PO) Take by mouth.   . cholecalciferol (VITAMIN D) 1000 UNITS tablet Take 2,000 Units by mouth daily.  08/15/2020: Takes 2000 IU most days  . ibuprofen (ADVIL) 200 MG tablet Take 400 mg by mouth every 6 (six) hours as needed. 01/30/2021: Last dose 4am  . zinc gluconate 50 MG tablet Take 50 mg by mouth daily.   . [DISCONTINUED] SUTAB [phone] MG TABS     No facility-administered encounter medications on file as of 01/30/2021.    Allergies  Allergen Reactions  . Carbohydrate     intolerance  . Tetracyclines & Related Other (See Comments)    Joint pain  . Xifaxan [Rifaximin] Other (See Comments)    Joint pains (hips)   ROS: per HPI above   Observations/Objective:  Ht 5\' 8"  (1.727 m)   Wt 140 lb (63.5 kg)   LMP 09/06/2011   BMI 21.29 kg/m   Well-appearing female, with some sniffles/congestion and occasional cough. She is in no distress, speaking easily, in full sentences. She is alert, oriented, cranial nerves grossly intact. Exam is limited due to virtual nature of the visit  Assessment and Plan:  COVID-19 - She is not high risk, so treatments are not an option at this time.  Reviewed supportive measures and s/sx for which to seek additional care  Reviewed isolation measures, when is safe to return to work. Doesn't need any work note.  Follow Up Instructions:    I discussed the assessment and treatment plan with the patient. The patient was provided an opportunity to ask questions and all were answered. The patient agreed with the plan and demonstrated an understanding of the instructions.   The patient was advised to call back or seek an in-person evaluation if the symptoms worsen or if the condition fails to improve as anticipated.  I spent 21 minutes dedicated to the care of this patient, including pre-visit review of records, face to face time, post-visit ordering of testing and documentation.    Vikki Ports, MD

## 2021-02-27 ENCOUNTER — Encounter: Payer: Self-pay | Admitting: Family Medicine

## 2021-04-03 ENCOUNTER — Telehealth: Payer: Self-pay

## 2021-04-03 NOTE — Telephone Encounter (Signed)
Patient is calling because she had a referral sent to [facility] earlier this year. She states she has called them multiply times with out a appointment being made. She states she called them this morning and they told her they did not know when the appointment could be made. Patient would like to know if she can be referred to [facility] or [facility]

## 2021-04-04 NOTE — Telephone Encounter (Signed)
Called patient back and told her that her referral was faxed to [facility] and that they would call her in a week or two to schedule her an appointment and if she did not hear back from them, then to give them a call at [phone].

## 2021-06-04 DIAGNOSIS — R142 Eructation: Secondary | ICD-10-CM | POA: Insufficient documentation

## 2021-06-04 DIAGNOSIS — R140 Abdominal distension (gaseous): Secondary | ICD-10-CM | POA: Insufficient documentation

## 2021-08-26 ENCOUNTER — Other Ambulatory Visit: Payer: Self-pay | Admitting: Family Medicine

## 2021-08-26 DIAGNOSIS — Z1231 Encounter for screening mammogram for malignant neoplasm of breast: Secondary | ICD-10-CM

## 2021-08-28 ENCOUNTER — Encounter: Payer: Self-pay | Admitting: Family Medicine

## 2021-08-28 NOTE — Progress Notes (Signed)
Chief Complaint  Patient presents with   Annual Exam    Fasting annual exam, no new concerns. Is scheduled for her eye exam next Thursday with Dr. Maurie Boettcher. Would like to do Tdap today.    Annette Marks is a 60 y.o. female who presents for a complete physical.   She has the following concerns:  She has had ongoing GI issues over the last year (2 years in total).  Complaints are belching and bloating. She has pain in the upper stomach which is relieved by belching. She had full evaluation, including imaging, EGD, colonoscopy, labs. Mild lactase deficiency was noted, (noted on labs, and confirmed with breath test; had sucrase noted on breath test, but not confirmed with biopsy); mild gastritis on EGD and tubular adenoma on colonoscopy.  Xifaxan and doxycycline temporarily helped, wasn't able to tolerate subsequent trial (joint pains).   She got a second opinion at [facility] in 05/2021.  She has been following low FODMAP diet.  It is suspected that this could be long COVID (related to undiagnosed COVID early on in the pandemic, related to international students who returned to school sick).  She was told to take metamucil wafer and probiotics, and was referred for GI nutrition visit. She just saw them earlier this week.   Probiotics made it worse, so stopped taking them. Stopped Metamucil since she is now moving her bowels daily (had been qod).   She will start food journal and has a list of medium fodmap foods to try and introduce slowly. Will see her again next month. Symptoms are well controlled as long as she stays on FODMAP diet.  Has increased belching/symptoms when she eats other foods; flared at Thanksgiving and IBGard (peppermint oil) seems to help.  Vitamin D deficiency:  Last level was normal at 40.2 in 07/2020, when taking 2000 IU most days.  Level was low at 26.2 in 2020, when taking 2000 IU 4-5 times/week.  She is currently taking 2000 IU every day. She recently started a MVI as  well.  Mild hyperlipidemia:  Her LDL had been creeping up over the last few years. In 2018 LDL was 124, 149 in 2019, and up to 162 in 2020.  It was down to 142 in 07/2020.  HDL remained good.  No dairy.  Eating mainly egg whites, has about 6 yolks/month. Eating red meat (usually bison once/week. Chicken, [place], fish.  Lab Results  Component Value Date   CHOL 239 (H) 08/15/2020   HDL 87 08/15/2020   LDLCALC 142 (H) 08/15/2020   TRIG 58 08/15/2020   CHOLHDL 2.7 08/15/2020   BP's have been low at the dentist, and normal if she checks it at work. She had some borderline/elevated BP's in the past. Higher at GI when anxious. BP Readings from Last 3 Encounters:  08/29/21 130/86  11/09/20 116/78  10/15/20 127/79     Immunization History  Administered Date(s) Administered   Hepatitis B 02/20/1985, 04/22/1985, 04/20/1992   Influenza Whole 06/12/2011, 06/08/2013   Influenza, Quadrivalent, Recombinant, Inj, Pf 06/09/2019   Influenza,inj,Quad PF,6+ Mos 06/01/2015   Influenza-Unspecified 06/26/2016, 06/19/2017, 06/08/2018, 06/05/2020, 06/04/2021   PFIZER(Purple Top)SARS-COV-2 Vaccination 10/10/2019, 10/31/2019   PPD Test 12/22/2011, 01/07/2012, 08/22/2013   Td 01/01/2004   Tdap 12/22/2011   Prefers not to get COVID booster--? If GI issues related Last Pap smear: 06/2018, normal, no high risk HPV (did have HPV on 2018 pap) Last mammogram: 08/2020; had biopsy on R, showed COMPLEX SCLEROSING LESION WITH MICROCALCIFICATIONS; underwent  excision/lumpectomy in 10/2020, Fibrocystic change with apocrine metaplasia. Scheduled for mammo 09/2021. Last colonoscopy: 09/2020, tubular adenoma. Doesn't recall being told when follow-up is due Last DEXA: normal through school--heel screen (over 10 years ago)   Drinks a lot of soy milk. Dentist: 4 times yearly   Ophtho: wears glasses, goes yearly (Dr. Delman Cheadle) Exercise: Power Yoga (some cardio and weight-bearing) 2-3x/week, deep stretch yoga 2 days/week.  Walking  4 miles 2-3x/week. Not running since illness 11/2018 and current GI issues. Uses weights at home 3x/week.   PMH, PSH, SH and FH were reviewed and updated.  Outpatient Encounter Medications as of 08/29/2021  Medication Sig Note   cholecalciferol (VITAMIN D) 1000 UNITS tablet Take 2,000 Units by mouth daily.     Multiple Vitamins-Minerals (WOMENS MULTIVITAMIN PO) Take 1 tablet by mouth daily.    NON FORMULARY Take 2 capsules by mouth daily. 08/29/2021: IB Gard   ibuprofen (ADVIL) 200 MG tablet Take 400 mg by mouth every 6 (six) hours as needed. (Patient not taking: Reported on 08/29/2021) 08/29/2021: Only as needed   [DISCONTINUED] Charcoal Activated (ACTIVATED CHARCOAL PO) Take by mouth.    [DISCONTINUED] zinc gluconate 50 MG tablet Take 50 mg by mouth daily.    No facility-administered encounter medications on file as of 08/29/2021.   Allergies  Allergen Reactions   Carbohydrate     intolerance   Tetracyclines & Related Other (See Comments)    Joint pain   Xifaxan [Rifaximin] Other (See Comments)    Joint pains (hips)    ROS: The patient denies fever, headaches, vision changes, decreased hearing, ear pain, sore throat, breast concerns, chest pain, palpitations, dizziness, syncope, dyspnea on exertion, cough, swelling, nausea, vomiting, diarrhea, constipation, melena, hematochezia, hematuria, incontinence, dysuria, vaginal bleeding, discharge, odor or itch, genital lesions, joint pains, numbness, tingling, weakness, tremor, suspicious skin lesions, depression, anxiety, abnormal bleeding/bruising, or enlarged lymph nodes. Sees derm (Dr. Wilhemina Bonito) yearly (had biopsy done yesterday) Hot flashes are mild and intermittent. Bloating, belching, abdominal pain per HPI, improved when on FODMAP diet. Recent cold, resolved.    PHYSICAL EXAM:  BP 130/86   Pulse (!) 56   Ht 5' 8" (1.727 m)   Wt 149 lb 9.6 oz (67.9 kg)   LMP 09/06/2011   BMI 22.75 kg/m   Wt Readings from Last 3 Encounters:   08/29/21 149 lb 9.6 oz (67.9 kg)  01/30/21 140 lb (63.5 kg)  11/09/20 147 lb 7.8 oz (66.9 kg)    General Appearance:   Alert, cooperative, no distress, appears stated age    Head:   Normocephalic, without obvious abnormality, atraumatic    Eyes:   PERRL, conjunctiva/corneas clear, EOM's intact, fundi benign    Ears:   Normal TM's and external ear canals    Nose:   Not examined, wearing mask due to COVID-19 pandemic  Throat:   Not examined, wearing mask due to COVID-19 pandemic   Neck:   Supple, no lymphadenopathy; thyroid: no enlargement/tenderness/nodules; no carotid bruit or JVD    Back:   Spine nontender, no curvature, ROM normal, no CVA tenderness    Lungs:   Clear to auscultation bilaterally without wheezes, rales or ronchi; respirations unlabored    Chest Wall:   No tenderness or deformity    Heart:   Regular rate and rhythm, S1 and S2 normal, no murmur, rub or gallop    Breast Exam:   No skin dimpling, nipple inversion, nipple discharge, breast mass or axillary lymphadenopathy. WHSS at lateral aspect of  areola R breast  Abdomen:   Soft, non-tender, nondistended, normoactive bowel sounds, no masses, no hepatosplenomegaly.   Genitalia:   Normal external genitalia. BUS and vagina normal. No cervical motion tenderness. No cervical lesions or discharge. Uterus and adnexa are nontender, no mass. Pap performed  Rectal:   Normal sphincter tone, no mass.  Heme negative stool  Extremities:   No clubbing, cyanosis or edema    Pulses:   2+ and symmetric all extremities    Skin:   Skin color, texture, turgor normal, no rashes.    Lymph nodes:   Cervical, supraclavicular, inguinal and axillary nodes normal    Neurologic:   Normal strength, sensation and gait; reflexes 2+ and symmetric throughout                              Psych:  Normal mood, affect, hygiene and grooming   ASSESSMENT/PLAN:  Annual physical exam - Plan: POCT Urinalysis DIP (Proadvantage Device), Comprehensive metabolic  panel, CBC with Differential/Platelet, Lipid panel, Cytology - PAP([place])  Pure hypercholesterolemia - reviewed low cholesterol diet - Plan: Lipid panel  Vitamin D deficiency - continue daily supplements  Need for Tdap vaccination - Plan: Tdap vaccine greater than or equal to 7yo IM  Cbc, c-met, lipids  Discussed monthly self breast exams and yearly mammograms; at least 30 minutes of aerobic activity at least 5 days/week, weight-bearing exercise at least 2x/week; proper sunscreen use reviewed; healthy diet, including goals of calcium and vitamin D intake and alcohol recommendations (less than or equal to 1 drink/day) reviewed; regular seatbelt use; changing batteries in smoke detectors. Immunization recommendations discussed--UTD. Continue yearly flu shots. COVID booster offered, declined.  TdaP given. Shingrix recommended, risks/side effects reviewed, to return for NV when convenient.  Colonoscopy recommendations reviewed--UTD. To check with GI when due again (5 vs 7 yrs?)   F/u 1 year, sooner prn.

## 2021-08-28 NOTE — Patient Instructions (Addendum)
  HEALTH MAINTENANCE RECOMMENDATIONS:  It is recommended that you get at least 30 minutes of aerobic exercise at least 5 days/week (for weight loss, you may need as much as 60-90 minutes). This can be any activity that gets your heart rate up. This can be divided in 10-15 minute intervals if needed, but try and build up your endurance at least once a week.  Weight bearing exercise is also recommended twice weekly.  Eat a healthy diet with lots of vegetables, fruits and fiber.  "Colorful" foods have a lot of vitamins (ie green vegetables, tomatoes, red peppers, etc).  Limit sweet tea, regular sodas and alcoholic beverages, all of which has a lot of calories and sugar.  Up to 1 alcoholic drink daily may be beneficial for women (unless trying to lose weight, watch sugars).  Drink a lot of water.  Calcium recommendations are 1200-1500 mg daily (1500 mg for postmenopausal women or women without ovaries), and vitamin D 1000 IU daily.  This should be obtained from diet and/or supplements (vitamins), and calcium should not be taken all at once, but in divided doses.  Monthly self breast exams and yearly mammograms for women over the age of 59 is recommended.  Sunscreen of at least SPF 30 should be used on all sun-exposed parts of the skin when outside between the hours of 10 am and 4 pm (not just when at beach or pool, but even with exercise, golf, tennis, and yard work!)  Use a sunscreen that says "broad spectrum" so it covers both UVA and UVB rays, and make sure to reapply every 1-2 hours.  Remember to change the batteries in your smoke detectors when changing your clock times in the spring and fall. Carbon monoxide detectors are recommended for your home.  Use your seat belt every time you are in a car, and please drive safely and not be distracted with cell phones and texting while driving.  I recommend getting the new shingles vaccine (Shingrix). You may want to check with your insurance to verify what  your out of pocket cost may be (usually covered as preventative, but better to verify to avoid any surprises, as this vaccine is expensive), and then schedule a nurse visit at our office when convenient (based on the possible side effects as discussed).   This is a series of 2 injections, spaced 2 months apart.  It doesn't have to be exactly 2 months apart (but can't be sooner), if that isn't feasible for your schedule, but try and get them close to 2 months (and definitely within 6 months of each other, or else the efficacy of the vaccine drops off). This should be separated from other vaccines by at least 2 weeks.  Check with your GI as to when your next colonoscopy is due (and sent me a note).

## 2021-08-29 ENCOUNTER — Other Ambulatory Visit (HOSPITAL_COMMUNITY)
Admission: RE | Admit: 2021-08-29 | Discharge: 2021-08-29 | Disposition: A | Discharge status: Home / Self Care | Payer: Managed Care, Other (non HMO) | Source: Ambulatory Visit | Attending: Family Medicine | Admitting: Family Medicine

## 2021-08-29 ENCOUNTER — Encounter: Payer: Self-pay | Admitting: Family Medicine

## 2021-08-29 ENCOUNTER — Ambulatory Visit (INDEPENDENT_AMBULATORY_CARE_PROVIDER_SITE_OTHER): Payer: Managed Care, Other (non HMO) | Admitting: Family Medicine

## 2021-08-29 ENCOUNTER — Other Ambulatory Visit: Payer: Self-pay

## 2021-08-29 VITALS — BP 130/86 | HR 56 | Ht 68.0 in | Wt 149.6 lb

## 2021-08-29 DIAGNOSIS — Z23 Encounter for immunization: Secondary | ICD-10-CM | POA: Diagnosis not present

## 2021-08-29 DIAGNOSIS — E7800 Pure hypercholesterolemia, unspecified: Secondary | ICD-10-CM | POA: Diagnosis not present

## 2021-08-29 DIAGNOSIS — E559 Vitamin D deficiency, unspecified: Secondary | ICD-10-CM | POA: Diagnosis not present

## 2021-08-29 DIAGNOSIS — Z0000 Encounter for general adult medical examination without abnormal findings: Secondary | ICD-10-CM | POA: Insufficient documentation

## 2021-08-29 LAB — POCT URINALYSIS DIP (PROADVANTAGE DEVICE)
Bilirubin, UA: NEGATIVE
Blood, UA: NEGATIVE
Glucose, UA: NEGATIVE mg/dL
Ketones, POC UA: NEGATIVE mg/dL
Leukocytes, UA: NEGATIVE
Nitrite, UA: NEGATIVE
Protein Ur, POC: NEGATIVE mg/dL
Specific Gravity, Urine: 1.015
Urobilinogen, Ur: NEGATIVE
pH, UA: 7.5 (ref 5.0–8.0)

## 2021-08-30 LAB — CBC WITH DIFFERENTIAL/PLATELET
Basophils Absolute: 0.1 10*3/uL (ref 0.0–0.2)
Basos: 1 %
EOS (ABSOLUTE): 0.1 10*3/uL (ref 0.0–0.4)
Eos: 1 %
Hematocrit: 39.4 % (ref 34.0–46.6)
Hemoglobin: 13.2 g/dL (ref 11.1–15.9)
Immature Grans (Abs): 0 10*3/uL (ref 0.0–0.1)
Immature Granulocytes: 0 %
Lymphocytes Absolute: 1.4 10*3/uL (ref 0.7–3.1)
Lymphs: 28 %
MCH: 29.3 pg (ref 26.6–33.0)
MCHC: 33.5 g/dL (ref 31.5–35.7)
MCV: 87 fL (ref 79–97)
Monocytes Absolute: 0.5 10*3/uL (ref 0.1–0.9)
Monocytes: 9 %
Neutrophils Absolute: 2.9 10*3/uL (ref 1.4–7.0)
Neutrophils: 61 %
Platelets: 271 10*3/uL (ref 150–450)
RBC: 4.51 x10E6/uL (ref 3.77–5.28)
RDW: 11.1 % — ABNORMAL LOW (ref 11.7–15.4)
WBC: 4.9 10*3/uL (ref 3.4–10.8)

## 2021-08-30 LAB — COMPREHENSIVE METABOLIC PANEL
ALT: 24 IU/L (ref 0–32)
AST: 27 IU/L (ref 0–40)
Albumin/Globulin Ratio: 2.2 (ref 1.2–2.2)
Albumin: 4.4 g/dL (ref 3.8–4.8)
Alkaline Phosphatase: 61 IU/L (ref 44–121)
BUN/Creatinine Ratio: 21 (ref 12–28)
BUN: 17 mg/dL (ref 8–27)
Bilirubin Total: 0.3 mg/dL (ref 0.0–1.2)
CO2: 22 mmol/L (ref 20–29)
Calcium: 9 mg/dL (ref 8.7–10.3)
Chloride: 106 mmol/L (ref 96–106)
Creatinine, Ser: 0.82 mg/dL (ref 0.57–1.00)
Globulin, Total: 2 g/dL (ref 1.5–4.5)
Glucose: 87 mg/dL (ref 70–99)
Potassium: 4.6 mmol/L (ref 3.5–5.2)
Sodium: 145 mmol/L — ABNORMAL HIGH (ref 134–144)
Total Protein: 6.4 g/dL (ref 6.0–8.5)
eGFR: 81 mL/min/{1.73_m2} (ref >59)

## 2021-08-30 LAB — LIPID PANEL
Chol/HDL Ratio: 2.8 ratio (ref 0.0–4.4)
Cholesterol, Total: 196 mg/dL (ref 100–199)
HDL: 71 mg/dL (ref >39)
LDL Chol Calc (NIH): 114 mg/dL — ABNORMAL HIGH (ref 0–99)
Triglycerides: 57 mg/dL (ref 0–149)
VLDL Cholesterol Cal: 11 mg/dL (ref 5–40)

## 2021-09-02 LAB — CYTOLOGY - PAP
Comment: NEGATIVE
Diagnosis: NEGATIVE
High risk HPV: POSITIVE — AB

## 2021-09-11 ENCOUNTER — Encounter: Payer: Self-pay | Admitting: Family Medicine

## 2021-09-26 ENCOUNTER — Ambulatory Visit
Admission: RE | Admit: 2021-09-26 | Discharge: 2021-09-26 | Disposition: A | Discharge status: Home / Self Care | Payer: Managed Care, Other (non HMO) | Source: Ambulatory Visit | Attending: Family Medicine | Admitting: Family Medicine

## 2021-09-26 DIAGNOSIS — Z1231 Encounter for screening mammogram for malignant neoplasm of breast: Secondary | ICD-10-CM

## 2022-01-14 ENCOUNTER — Ambulatory Visit: Payer: Managed Care, Other (non HMO) | Admitting: Family Medicine

## 2022-01-14 ENCOUNTER — Encounter: Payer: Self-pay | Admitting: Family Medicine

## 2022-01-14 VITALS — BP 112/64 | HR 64 | Temp 98.8°F | Ht 68.0 in | Wt 152.6 lb

## 2022-01-14 DIAGNOSIS — R062 Wheezing: Secondary | ICD-10-CM

## 2022-01-14 DIAGNOSIS — J4521 Mild intermittent asthma with (acute) exacerbation: Secondary | ICD-10-CM

## 2022-01-14 DIAGNOSIS — J069 Acute upper respiratory infection, unspecified: Secondary | ICD-10-CM | POA: Diagnosis not present

## 2022-01-14 MED ORDER — ALBUTEROL SULFATE HFA 108 (90 BASE) MCG/ACT IN AERS: 2.0000 | INHALATION_SPRAY | RESPIRATORY_TRACT | 1 refills | Status: DC | PRN

## 2022-01-14 NOTE — Patient Instructions (Signed)
Stay well hydrated. ?Use sudafed (and/or antihistamines if you feel there is an allergic component) to help with the congestion (runny nose, postnasal drainage). ?You may use mucinex (plain or DM if you're coughing a lot, or plain with a separate Delsym syrup to use if needed for cough).  This will help keep the mucus thin. ? ?Contact us if you develop fever, pain with breathing, worsening shortness of breath. ? ?Use the albuterol every 4-6 hours if needed for wheezing or shortness of breath. ?If you needing it very frequently for more than a few days, let us know. ?

## 2022-01-14 NOTE — Progress Notes (Signed)
Chief Complaint  ?Patient presents with  ? Cough  ?  Started Sat. Thinks her grandson has Fifth's disease and she was taking care of him. She has a terrible cough and runny nose. She is having a hard time taking a deep breath. She had an old albuterol inhaler and it did help and she could catch her breath. No fevers. Took covid tests Sat, Sun and today.   ? ?Taking leave of absence from her [employer] job, helping to care for her grandson full time. ?He was sick last week, possible Fifth's disease, and she was caring for him. ?She started with runny nose late last week.  On Sat/Sun developed cough, worsening. ?She had negative COVID tests x3, last was today. ? ?Today while lying on the couch, almost asleep, she woke up wheezing, couldn't take a deep breath. This scared her a lot. She had an old albuterol inhaler (expired 2021).  She took 2 puffs and immediately felt better. This was about 2 hours prior to evaluation (2pm).   ?She is feeling better, but still a little tight. ? ?This morning she coughed up small amount of white, slightly yellow phlegm. ?Nasal drainage is clear, runny like a faucet. ?No itchy eyes or allergy symptoms ? ?She has a h/o RAD with URI's ? ? ?PMH, PSH, SH reviewed ? ?Outpatient Encounter Medications as of 01/14/2022  ?Medication Sig Note  ? albuterol (PROAIR HFA) 108 (90 Base) MCG/ACT inhaler Inhale 2 puffs into the lungs every 4 (four) hours as needed for wheezing or shortness of breath.   ? cholecalciferol (VITAMIN D) 1000 UNITS tablet Take 2,000 Units by mouth daily.    ? Multiple Vitamins-Minerals (WOMENS MULTIVITAMIN PO) Take 1 tablet by mouth daily.   ? NON FORMULARY Take 1 capsule by mouth daily. 12/8/2022Ruthell Rummage  ? [DISCONTINUED] Albuterol Sulfate (PROAIR HFA IN) Inhale 1-2 puffs into the lungs as needed. 01/14/2022: Last used 2 puffs at 2pm  ? ibuprofen (ADVIL) 200 MG tablet Take 400 mg by mouth every 6 (six) hours as needed. (Patient not taking: Reported on 08/29/2021) 08/29/2021: Only as  needed  ? ?No facility-administered encounter medications on file as of 01/14/2022.  ? ?ROS:  No f/c, n/v/d ?Nausea only with breathing episode earlier today. ?No rash, arthralgias, or other symptoms. ?See HPI. ? ? ?PHYSICAL EXAM: ? ?BP 112/64   Pulse 64   Temp 98.8 ?F (37.1 ?C) (Tympanic)   Ht '5\' 8"'$  (1.727 m)   Wt 152 lb 9.6 oz (69.2 kg)   LMP 09/06/2011   BMI 23.20 kg/m?  ? ?Wt Readings from Last 3 Encounters:  ?01/14/22 152 lb 9.6 oz (69.2 kg)  ?08/29/21 149 lb 9.6 oz (67.9 kg)  ?01/30/21 140 lb (63.5 kg)  ? ? ?Mildly ill appearing female--very read around the nose, seems a little tired, intermittently coughing. ?HEENT: conjunctiva and sclera are clear, EOMI. TM's and EAC's normal. ?Red around and below nose, no crusting or swelling.  ?Nasal mucosa--mild edema with clear mucus. Sinuses nontender ?OP is clear, no lesions, erythema ?Neck: no lymphadenopathy, thyromegaly or mass ?Heart: regular rate and rhythm ?Lungs: fair air movement, no rales.   After nebulizer treatment, improved air movement was noted. No wheezes, rales, ronchi. ? ?PF pre: 360/350/370 (expected >400) ?PF post: 390/390/410 ? ? ?ASSESSMENT/PLAN: ? ?Wheezing - Plan: albuterol (PROAIR HFA) 108 (90 Base) MCG/ACT inhaler ? ?Mild intermittent reactive airway disease with acute exacerbation - RAD due to virus. Responded nicely to inhaler and neb. Continue q4-6 hours prn.  To call for steroids if needing frequently. f/u if worsening ? ?Viral upper respiratory illness - supportive measures reviewed, as well as s/sx bacterial infection. f/u if sx persist/worsen ? ?Sudafed vs antihistamines ?Mucinex plain or DM vs separate Delsym ? ?

## 2022-01-15 DIAGNOSIS — R062 Wheezing: Secondary | ICD-10-CM | POA: Diagnosis not present

## 2022-01-15 DIAGNOSIS — J4521 Mild intermittent asthma with (acute) exacerbation: Secondary | ICD-10-CM | POA: Diagnosis not present

## 2022-01-15 DIAGNOSIS — J069 Acute upper respiratory infection, unspecified: Secondary | ICD-10-CM

## 2022-01-15 MED ORDER — ALBUTEROL SULFATE (2.5 MG/3ML) 0.083% IN NEBU
2.5000 mg | INHALATION_SOLUTION | Freq: Once | RESPIRATORY_TRACT | Status: AC
  Administered 2022-01-15: 2.5 mg via RESPIRATORY_TRACT

## 2022-01-16 MED ORDER — ALBUTEROL SULFATE (2.5 MG/3ML) 0.083% IN NEBU: 2.5000 mg | INHALATION_SOLUTION | Freq: Once | RESPIRATORY_TRACT | Status: DC

## 2022-01-16 NOTE — Progress Notes (Signed)
Removed. [place] ?

## 2022-01-30 ENCOUNTER — Encounter (HOSPITAL_COMMUNITY): Payer: Self-pay

## 2022-02-01 ENCOUNTER — Encounter: Payer: Self-pay | Admitting: Family Medicine

## 2022-02-01 NOTE — Telephone Encounter (Signed)
Please enter her 2nd Shingrix.  Thanks ?

## 2022-02-03 ENCOUNTER — Encounter: Payer: Self-pay | Admitting: *Deleted

## 2022-05-28 ENCOUNTER — Encounter: Payer: Self-pay | Admitting: Internal Medicine

## 2022-07-01 ENCOUNTER — Encounter: Payer: Self-pay | Admitting: Internal Medicine

## 2022-07-17 ENCOUNTER — Ambulatory Visit (INDEPENDENT_AMBULATORY_CARE_PROVIDER_SITE_OTHER): Payer: 59 | Admitting: Family Medicine

## 2022-07-17 ENCOUNTER — Encounter: Payer: Self-pay | Admitting: Family Medicine

## 2022-07-17 VITALS — BP 130/80 | HR 73 | Temp 100.6°F | Wt 164.2 lb

## 2022-07-17 DIAGNOSIS — R0989 Other specified symptoms and signs involving the circulatory and respiratory systems: Secondary | ICD-10-CM

## 2022-07-17 DIAGNOSIS — R509 Fever, unspecified: Secondary | ICD-10-CM | POA: Diagnosis not present

## 2022-07-17 DIAGNOSIS — R051 Acute cough: Secondary | ICD-10-CM

## 2022-07-17 DIAGNOSIS — J069 Acute upper respiratory infection, unspecified: Secondary | ICD-10-CM

## 2022-07-17 LAB — POCT INFLUENZA A/B
Influenza A, POC: NEGATIVE
Influenza B, POC: NEGATIVE

## 2022-07-17 NOTE — Patient Instructions (Signed)
  Please drink plenty of water, and get plenty of rest.  Consider using pseudoephedrine to help with runny nose. Consider dextromethorphan (either switch to Mucinex DM or add Delsym syrup to the plain Mucinex).  Go for a chest x-ray if persistent cough, fever, shortness of breath. Use the albuterol if needed for shortness of breath. If these things occur on the weekend, and you're very sick, go to urgent care of the ER rather than waiting for an x-ray on Monday.

## 2022-07-17 NOTE — Progress Notes (Signed)
Chief Complaint  Patient presents with   sick    Cough, runny nose, with chills. Symptoms started on Sunday. Covid negative today   She has been taking care of her 62 yo grandson and his friend.  This friend was sick last week with cold/cough, negative for COVID and RSV. She is feeling better, and has been back with them this week.  Sunday 10/22 she and her grandson started with runny nose (clear), cough.  She has some chest tightness and congestion. Last night she had burning in her sinuses. This is improved this morning.  Went to bed, coughed most of the night. Cough got worse Monday.  Phlegm is pale yellow, small amounts, all day long.  Hasn't used her albuterol, hasn't had shortness of breath.  She has been taking Mucinex 12 hour (plain), twice daily. Using advil twice daily for achiness. Had body aches yesterday and today. Continues to have a runny nose, all clear.  Minimal sinus discomfort. Only intermittent sore throat.   PMH, PSH, SH reviewed  Outpatient Encounter Medications as of 07/17/2022  Medication Sig Note   albuterol (PROAIR HFA) 108 (90 Base) MCG/ACT inhaler Inhale 2 puffs into the lungs every 4 (four) hours as needed for wheezing or shortness of breath.    cholecalciferol (VITAMIN D) 1000 UNITS tablet Take 2,000 Units by mouth daily.     ibuprofen (ADVIL) 200 MG tablet Take 400 mg by mouth every 6 (six) hours as needed. 08/29/2021: Only as needed   Multiple Vitamins-Minerals (WOMENS MULTIVITAMIN PO) Take 1 tablet by mouth daily.    NON FORMULARY Take 1 capsule by mouth daily. (Patient not taking: Reported on 07/17/2022) 08/29/2021: IB Gard   No facility-administered encounter medications on file as of 07/17/2022.   Allergies  Allergen Reactions   Carbohydrate     intolerance   Tetracyclines & Related Other (See Comments)    Joint pain   ROS: +fever, myalgias, URI symptoms per HPI.  No exertional chest pain, shortness of breath, rashes. No n/v/d.   PHYSICAL  EXAM:  BP 130/80   Pulse 73   Temp (!) 100.6 F (38.1 C)   Wt 164 lb 3.2 oz (74.5 kg)   LMP 09/06/2011   SpO2 98%   BMI 24.97 kg/m   Mildly ill-appearing female, intermittently coughing, in no acute distress HEENT: conjunctiva and sclera are clear, EOMI. TM's and EAC's are normal. Nasal mucosa is mod edematous, with mod amount of clearish-slightly white/light yellow mucus in R>L nares. Sinuses nontender. OP--slight erythema of tonsillar pillars, normal otherwise, moist mucus membranes Neck: supple, FROM, no lymphadenopathy, nontender. Heart: regular rate and rhythm Lungs: clear bilaterally. Some mild ronchi which cleared with cough. Good air movement, no wheezing Skin: normal turgor, no rash Neuro: alert and oriented, cranial nerves intact, normal gait.  Negative influenza A&B Pt did a negative COVID test at home this morning   ASSESSMENT/PLAN:  Viral upper respiratory illness - supportive measures reviewed. CXR if persistent/worsening fever, cough. To UC/ER over weekend if needed  Runny nose - Plan: Influenza A/B  Fever, unspecified fever cause - Plan: Influenza A/B, DG Chest 2 View  Acute cough - Plan: DG Chest 2 View  Please drink plenty of water, and get plenty of rest.  Consider using pseudoephedrine to help with runny nose. Consider dextromethorphan (either switch to Mucinex DM or add Delsym syrup to the plain Mucinex).  Go for a chest x-ray if persistent cough, fever, shortness of breath. Use the albuterol if needed for shortness  of breath. If these things occur on the weekend, and you're very sick, go to urgent care of the ER rather than waiting for an x-ray on Monday.

## 2022-07-18 ENCOUNTER — Ambulatory Visit
Admission: RE | Admit: 2022-07-18 | Discharge: 2022-07-18 | Disposition: A | Discharge status: Home / Self Care | Payer: 59 | Source: Ambulatory Visit | Attending: Family Medicine | Admitting: Family Medicine

## 2022-07-18 DIAGNOSIS — R509 Fever, unspecified: Secondary | ICD-10-CM

## 2022-07-18 DIAGNOSIS — R051 Acute cough: Secondary | ICD-10-CM

## 2022-07-21 ENCOUNTER — Encounter: Payer: Self-pay | Admitting: Family Medicine

## 2022-07-21 MED ORDER — AMOXICILLIN-POT CLAVULANATE 875-125 MG PO TABS: 1.0000 | ORAL_TABLET | Freq: Two times a day (BID) | ORAL | 0 refills | Status: DC

## 2022-08-18 ENCOUNTER — Other Ambulatory Visit: Payer: Self-pay | Admitting: Family Medicine

## 2022-08-18 DIAGNOSIS — Z1231 Encounter for screening mammogram for malignant neoplasm of breast: Secondary | ICD-10-CM

## 2022-09-15 DIAGNOSIS — K582 Mixed irritable bowel syndrome: Secondary | ICD-10-CM | POA: Insufficient documentation

## 2022-09-15 DIAGNOSIS — E739 Lactose intolerance, unspecified: Secondary | ICD-10-CM | POA: Insufficient documentation

## 2022-09-15 NOTE — Patient Instructions (Signed)
  HEALTH MAINTENANCE RECOMMENDATIONS:  It is recommended that you get at least 30 minutes of aerobic exercise at least 5 days/week (for weight loss, you may need as much as 60-90 minutes). This can be any activity that gets your heart rate up. This can be divided in 10-15 minute intervals if needed, but try and build up your endurance at least once a week.  Weight bearing exercise is also recommended twice weekly.  Eat a healthy diet with lots of vegetables, fruits and fiber.  "Colorful" foods have a lot of vitamins (ie green vegetables, tomatoes, red peppers, etc).  Limit sweet tea, regular sodas and alcoholic beverages, all of which has a lot of calories and sugar.  Up to 1 alcoholic drink daily may be beneficial for women (unless trying to lose weight, watch sugars).  Drink a lot of water.  Calcium recommendations are 1200-1500 mg daily (1500 mg for postmenopausal women or women without ovaries), and vitamin D 1000 IU daily.  This should be obtained from diet and/or supplements (vitamins), and calcium should not be taken all at once, but in divided doses.  Monthly self breast exams and yearly mammograms for women over the age of 56 is recommended.  Sunscreen of at least SPF 30 should be used on all sun-exposed parts of the skin when outside between the hours of 10 am and 4 pm (not just when at beach or pool, but even with exercise, golf, tennis, and yard work!)  Use a sunscreen that says "broad spectrum" so it covers both UVA and UVB rays, and make sure to reapply every 1-2 hours.  Remember to change the batteries in your smoke detectors when changing your clock times in the spring and fall. Carbon monoxide detectors are recommended for your home.  Use your seat belt every time you are in a car, and please drive safely and not be distracted with cell phones and texting while driving.  Consider RSV vaccine (from the pharmacy), as discussed.

## 2022-09-15 NOTE — Progress Notes (Unsigned)
No chief complaint on file.  Annette Marks is a 61 y.o. female who presents for a complete physical.   She has the following concerns:  She has had ongoing GI issues since 2020, with belching and bloating. She had full evaluation, including imaging, EGD, colonoscopy, labs. Mild lactase deficiency was noted, (noted on labs, and confirmed with breath test; had sucrase noted on breath test, but not confirmed with biopsy); mild gastritis on EGD and tubular adenoma on colonoscopy.  Xifaxan and doxycycline temporarily helped, wasn't able to tolerate subsequent trial (joint pains).  She had been doing well on her low FODMAP diet, but had recurrent symptoms earlier this year (after stressful events).  She was given another Xifaxan trial (limited to 10d due to prior SE), which worked well.  IBGard is also helpful. Probiotics and metamucil weren't helpful.   She remains on a restrictive diet, has trouble eating out, which limits her social opportunities, so she was encouraged to f/u with nutritionist to further help her. At her March visit she reported tolerating eggs, all meats, lactose-free milk, some soy products, most fruits, and some nuts. She remains on a gluten-free diet.  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 Per [facility], given the good response to rifaximin, a significant component of dysbiosis was suspected, but she did not get much benefit from a probiotic.   Vitamin D deficiency:  Last level was normal at 40.2 in 07/2020, when taking 2000 IU most days.  Level was low at 26.2 in 2020, when taking 2000 IU 4-5 times/week.  She is currently taking 2000 IU every day, along with MVI daily.  UPDATE  Mild hyperlipidemia:  Her LDL had elevated in 2019-2021. In 2018 LDL was 124, went up to 149 in 2019, then 162 in 2020.  It was down to 142 in  07/2020, and significantly better in 08/2021 (when on mor restrictive diet), at 114.  HDL is always good.   No dairy.  Eating mainly egg whites, has about 6 yolks/month. Eating red meat, usually bison, once/week. Chicken, [place], fish.  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 She had some borderline/elevated BP's in the past. Higher at GI when anxious. BP Readings from Last 3 Encounters:  07/17/22 130/80  01/14/22 112/64  08/29/21 130/86     Immunization History  Administered Date(s) Administered   Hepatitis B 02/20/1985, 04/22/1985, 04/20/1992   Influenza Inj Mdck Quad Pf 08/05/2022   Influenza Whole 06/12/2011, 06/08/2013   Influenza, Quadrivalent, Recombinant, Inj, Pf 06/09/2019   Influenza,inj,Quad  PF,6+ Mos 06/01/2015   Influenza-Unspecified 06/26/2016, 06/19/2017, 06/08/2018, 06/05/2020, 06/04/2021   PFIZER(Purple Top)SARS-COV-2 Vaccination 10/10/2019, 10/31/2019   PPD Test 12/22/2011, 01/07/2012, 08/22/2013   Td 01/01/2004   Tdap 12/22/2011, 08/29/2021   Zoster Recombinat (Shingrix) 10/11/2021, 01/31/2022   Prefers not to get COVID booster--? If GI issues related Last Pap smear: 08/2021--normal, but +HR HPV.  Prev was normal in 06/2018, no high risk HPV (did have HPV on 2018 pap) Last mammogram: 09/2021, scheduled for 10/2022.  In 08/2020 had biopsy on R, showed COMPLEX SCLEROSING LESION WITH MICROCALCIFICATIONS, s/p excision/lumpectomy in 10/2020, Fibrocystic change with apocrine metaplasia.   Last colonoscopy: 09/2020, tubular adenoma. Doesn't recall being told when follow-up is due Last DEXA: normal through school--heel screen (over 10 years ago)   Drinks a lot of soy milk. Dentist: 4 times yearly   Ophtho: wears glasses, goes yearly (Dr. Delman Cheadle) Exercise:  Power Yoga (some cardio and weight-bearing) 2-3x/week, deep stretch yoga 2 days/week.  Walking 4 miles 2-3x/week.  Uses weights at home 3x/week.   PMH, PSH, SH and FH were reviewed and updated.     ROS: The patient denies fever, headaches, vision changes, decreased hearing, ear pain, sore throat, breast concerns, chest pain, palpitations, dizziness, syncope, dyspnea on exertion, cough, swelling, nausea, vomiting, diarrhea, constipation, melena, hematochezia, hematuria, incontinence, dysuria, vaginal bleeding, discharge, odor or itch, genital lesions, joint pains, numbness, tingling, weakness, tremor, suspicious skin lesions, depression, anxiety, abnormal bleeding/bruising, or enlarged lymph nodes. Sees derm (Dr. Wilhemina Bonito) yearly. Hot flashes are mild and intermittent. Bloating, belching, abdominal pain per HPI, improved, on FODMAP diet.    PHYSICAL EXAM:  LMP 09/06/2011   Wt Readings from Last 3 Encounters:  07/17/22 164 lb 3.2 oz (74.5 kg)  01/14/22 152 lb 9.6 oz (69.2 kg)  08/29/21 149 lb 9.6 oz (67.9 kg)    General Appearance:   Alert, cooperative, no distress, appears stated age    Head:   Normocephalic, without obvious abnormality, atraumatic    Eyes:   PERRL, conjunctiva/corneas clear, EOM's intact, fundi benign    Ears:   Normal TM's and external ear canals    Nose:   Not examined, wearing mask due to COVID-19 pandemic  Throat:   Not examined, wearing mask due to COVID-19 pandemic   Neck:   Supple, no lymphadenopathy; thyroid: no enlargement/tenderness/ nodules; no carotid bruit or JVD    Back:   Spine nontender, no curvature, ROM normal, no CVA tenderness    Lungs:   Clear to auscultation bilaterally  without wheezes, rales or ronchi; respirations unlabored    Chest Wall:   No tenderness or deformity    Heart:   Regular rate and rhythm, S1 and S2 normal, no murmur, rub or gallop    Breast Exam:   No skin dimpling, nipple inversion, nipple discharge, breast mass or axillary lymphadenopathy. WHSS at lateral aspect of areola R breast  Abdomen:   Soft, non-tender, nondistended, normoactive bowel sounds, no masses, no hepatosplenomegaly.   Genitalia:   Normal external genitalia. BUS and vagina normal. No cervical motion tenderness. No cervical lesions or discharge. Uterus and adnexa are nontender, no mass. Pap performed  Rectal:   Normal sphincter tone, no mass.  Heme negative stool  Extremities:   No clubbing, cyanosis or edema    Pulses:   2+ and symmetric all extremities    Skin:   Skin color, texture, turgor normal, no rashes.    Lymph nodes:   Cervical, supraclavicular, inguinal and axillary nodes normal  Neurologic:   Normal strength, sensation and gait; reflexes 2+ and symmetric throughout                              Psych:  Normal mood, affect, hygiene and grooming   ASSESSMENT/PLAN:  Repeat pap today (due to +HPV last year). Order with reflex Offer/decline COVID booster  Discuss HPV vaccine  Cbc, c-met, lipids Consider D Consider TSH (normal in 07/2020)   Discussed monthly self breast exams and yearly mammograms; at least 30 minutes of aerobic activity at least 5 days/week, weight-bearing exercise at least 2x/week; proper sunscreen use reviewed; healthy diet, including goals of calcium and vitamin D intake and alcohol recommendations (less than or equal to 1 drink/day) reviewed; regular seatbelt use; changing batteries in smoke detectors. Immunization recommendations discussed--UTD. Continue yearly flu shots. COVID booster offered, declined.  RSV vaccine discussed. Colonoscopy recommendations reviewed--UTD.    F/u 1 year, sooner prn.

## 2022-09-17 ENCOUNTER — Other Ambulatory Visit (HOSPITAL_COMMUNITY)
Admission: RE | Admit: 2022-09-17 | Discharge: 2022-09-17 | Disposition: A | Discharge status: Home / Self Care | Payer: 59 | Source: Ambulatory Visit | Attending: Family Medicine | Admitting: Family Medicine

## 2022-09-17 ENCOUNTER — Encounter: Payer: Self-pay | Admitting: Family Medicine

## 2022-09-17 ENCOUNTER — Ambulatory Visit (INDEPENDENT_AMBULATORY_CARE_PROVIDER_SITE_OTHER): Payer: Managed Care, Other (non HMO) | Admitting: Family Medicine

## 2022-09-17 VITALS — BP 124/80 | HR 57 | Temp 98.4°F | Ht 68.0 in | Wt 157.8 lb

## 2022-09-17 DIAGNOSIS — R87810 Cervical high risk human papillomavirus (HPV) DNA test positive: Secondary | ICD-10-CM

## 2022-09-17 DIAGNOSIS — Z0000 Encounter for general adult medical examination without abnormal findings: Secondary | ICD-10-CM | POA: Insufficient documentation

## 2022-09-17 DIAGNOSIS — E559 Vitamin D deficiency, unspecified: Secondary | ICD-10-CM

## 2022-09-17 DIAGNOSIS — E7800 Pure hypercholesterolemia, unspecified: Secondary | ICD-10-CM

## 2022-09-17 DIAGNOSIS — E739 Lactose intolerance, unspecified: Secondary | ICD-10-CM

## 2022-09-17 DIAGNOSIS — K582 Mixed irritable bowel syndrome: Secondary | ICD-10-CM | POA: Diagnosis not present

## 2022-09-17 LAB — COMPREHENSIVE METABOLIC PANEL
ALT: 21 IU/L (ref 0–32)
AST: 26 IU/L (ref 0–40)
Albumin/Globulin Ratio: 2 (ref 1.2–2.2)
Albumin: 4.3 g/dL (ref 3.9–4.9)
Alkaline Phosphatase: 46 IU/L (ref 44–121)
BUN/Creatinine Ratio: 17 (ref 12–28)
BUN: 15 mg/dL (ref 8–27)
Bilirubin Total: 0.4 mg/dL (ref 0.0–1.2)
CO2: 23 mmol/L (ref 20–29)
Calcium: 9.2 mg/dL (ref 8.7–10.3)
Chloride: 105 mmol/L (ref 96–106)
Creatinine, Ser: 0.88 mg/dL (ref 0.57–1.00)
Globulin, Total: 2.2 g/dL (ref 1.5–4.5)
Glucose: 90 mg/dL (ref 70–99)
Potassium: 4.3 mmol/L (ref 3.5–5.2)
Sodium: 142 mmol/L (ref 134–144)
Total Protein: 6.5 g/dL (ref 6.0–8.5)
eGFR: 74 mL/min/{1.73_m2} (ref >59)

## 2022-09-17 LAB — LIPID PANEL
Chol/HDL Ratio: 3.2 ratio (ref 0.0–4.4)
Cholesterol, Total: 234 mg/dL — ABNORMAL HIGH (ref 100–199)
HDL: 74 mg/dL (ref >39)
LDL Chol Calc (NIH): 146 mg/dL — ABNORMAL HIGH (ref 0–99)
Triglycerides: 84 mg/dL (ref 0–149)
VLDL Cholesterol Cal: 14 mg/dL (ref 5–40)

## 2022-09-17 LAB — CBC WITH DIFFERENTIAL/PLATELET
Basophils Absolute: 0.1 10*3/uL (ref 0.0–0.2)
Basos: 1 %
EOS (ABSOLUTE): 0.1 10*3/uL (ref 0.0–0.4)
Eos: 1 %
Hematocrit: 43.9 % (ref 34.0–46.6)
Hemoglobin: 14.4 g/dL (ref 11.1–15.9)
Immature Grans (Abs): 0 10*3/uL (ref 0.0–0.1)
Immature Granulocytes: 0 %
Lymphocytes Absolute: 1.4 10*3/uL (ref 0.7–3.1)
Lymphs: 26 %
MCH: 29.1 pg (ref 26.6–33.0)
MCHC: 32.8 g/dL (ref 31.5–35.7)
MCV: 89 fL (ref 79–97)
Monocytes Absolute: 0.3 10*3/uL (ref 0.1–0.9)
Monocytes: 6 %
Neutrophils Absolute: 3.5 10*3/uL (ref 1.4–7.0)
Neutrophils: 66 %
Platelets: 249 10*3/uL (ref 150–450)
RBC: 4.94 x10E6/uL (ref 3.77–5.28)
RDW: 11.8 % (ref 11.7–15.4)
WBC: 5.3 10*3/uL (ref 3.4–10.8)

## 2022-09-25 LAB — CYTOLOGY - PAP
Comment: NEGATIVE
Diagnosis: NEGATIVE
High risk HPV: NEGATIVE

## 2022-10-13 ENCOUNTER — Ambulatory Visit: Payer: Managed Care, Other (non HMO)

## 2022-10-24 ENCOUNTER — Ambulatory Visit
Admission: RE | Admit: 2022-10-24 | Discharge: 2022-10-24 | Disposition: A | Discharge status: Home / Self Care | Payer: Managed Care, Other (non HMO) | Source: Ambulatory Visit | Attending: Family Medicine | Admitting: Family Medicine

## 2022-10-24 ENCOUNTER — Ambulatory Visit: Payer: Managed Care, Other (non HMO)

## 2022-10-24 DIAGNOSIS — Z1231 Encounter for screening mammogram for malignant neoplasm of breast: Secondary | ICD-10-CM

## 2022-12-05 ENCOUNTER — Other Ambulatory Visit: Payer: Self-pay | Admitting: Family Medicine

## 2022-12-05 DIAGNOSIS — R062 Wheezing: Secondary | ICD-10-CM

## 2022-12-26 IMAGING — MG MM BREAST BX W/ LOC DEV 1ST LESION IMAGE BX SPEC STEREO GUIDE*R*
6 of 10 series · 6 of 26 positions shown · non-contrast
Comparison: Previous exams.
COMPARISON: Previous exams.

Addendum:
CLINICAL DATA: Patient presents for stereotactic guided core biopsy
of RIGHT breast distortion.

EXAM:
RIGHT BREAST STEREOTACTIC CORE NEEDLE BIOPSY

[R (1 of 6)]
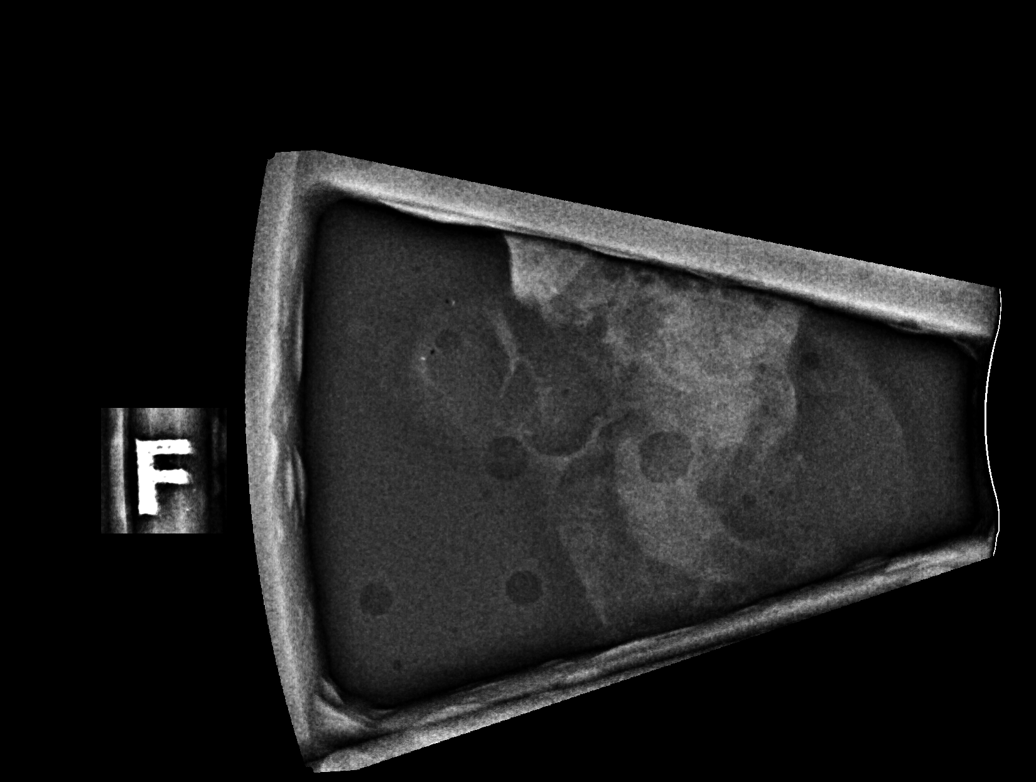

[R (2 of 6)]
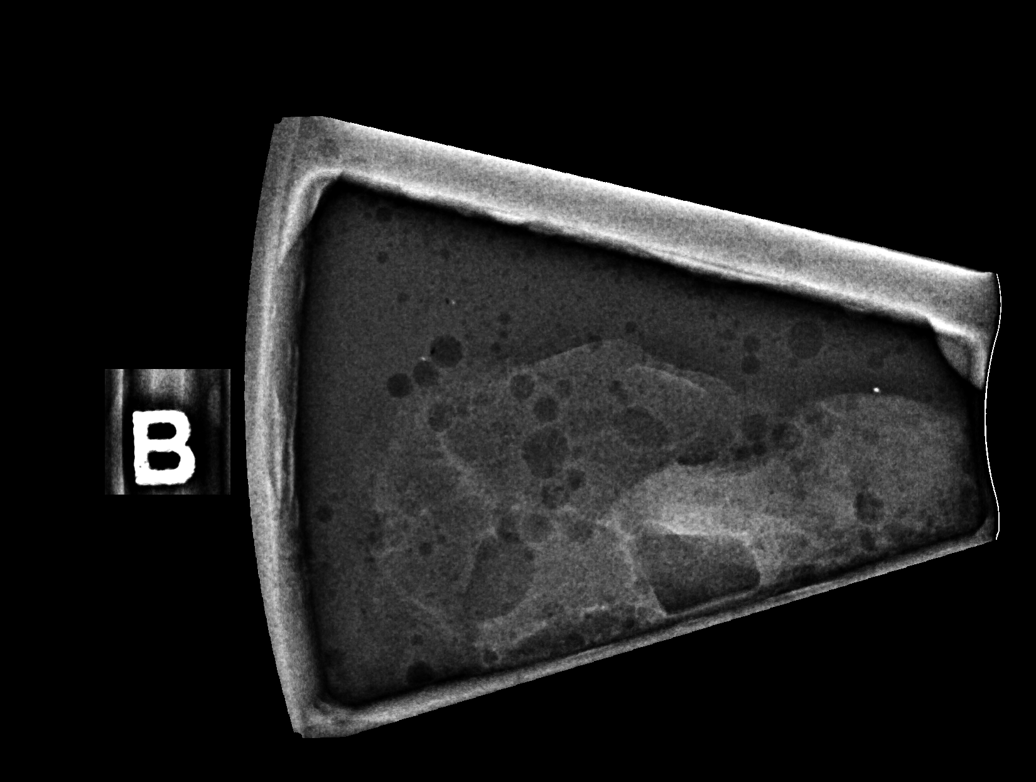

[R (3 of 6)]
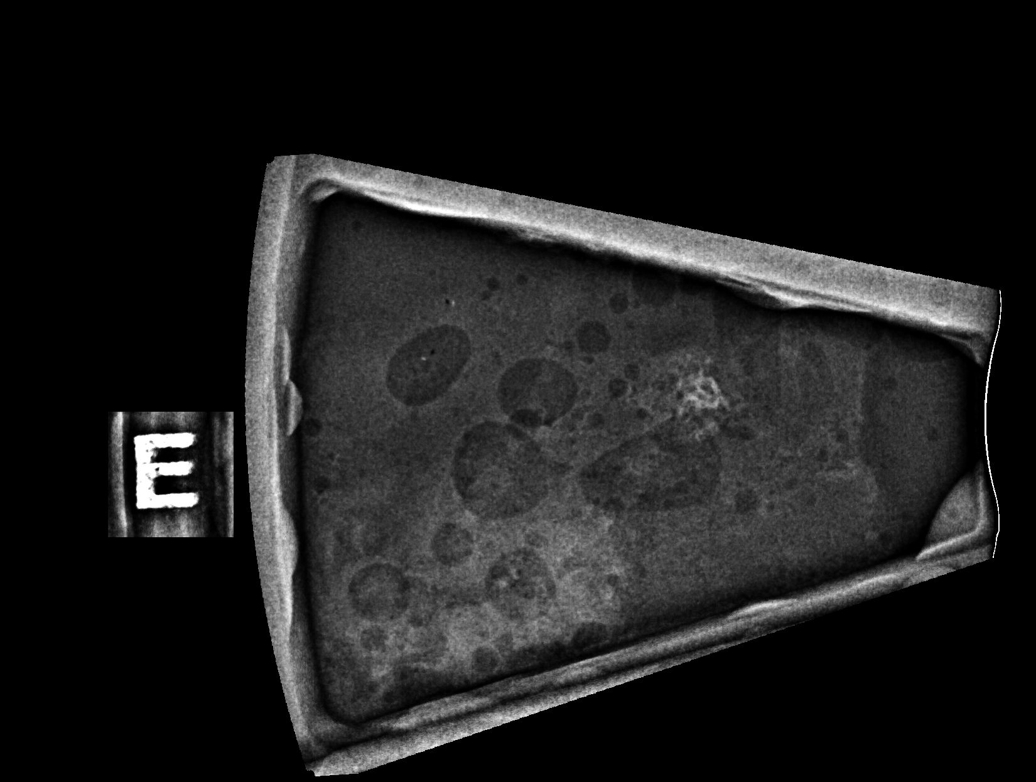

[R (4 of 6)]
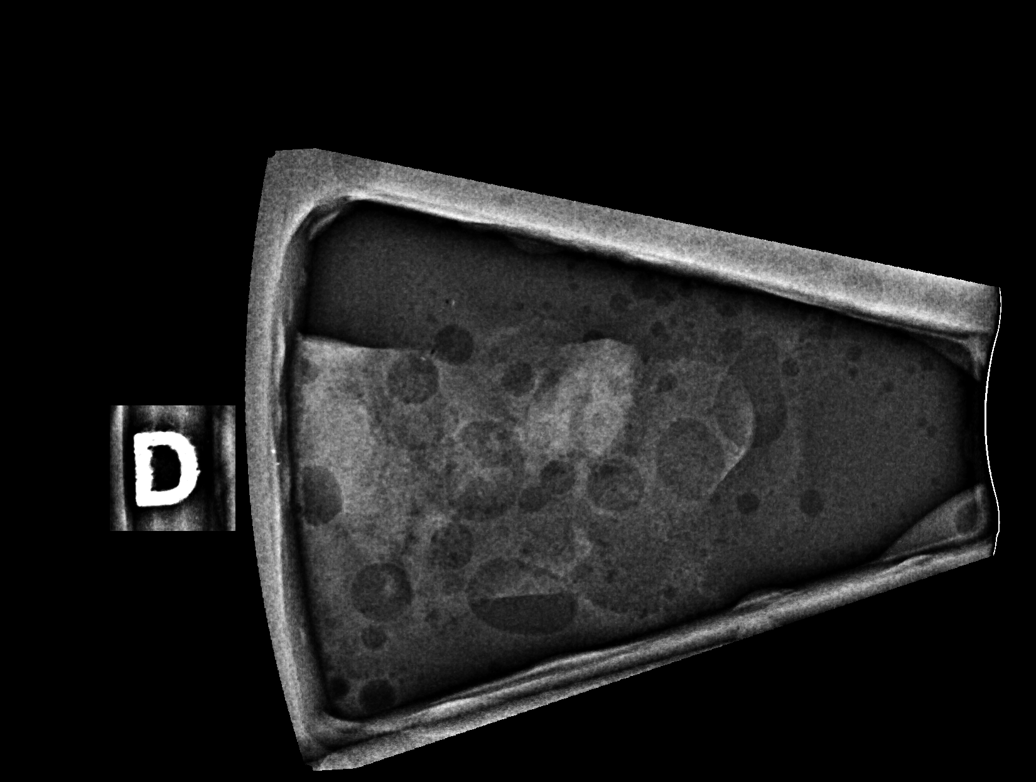

[R (5 of 6)]
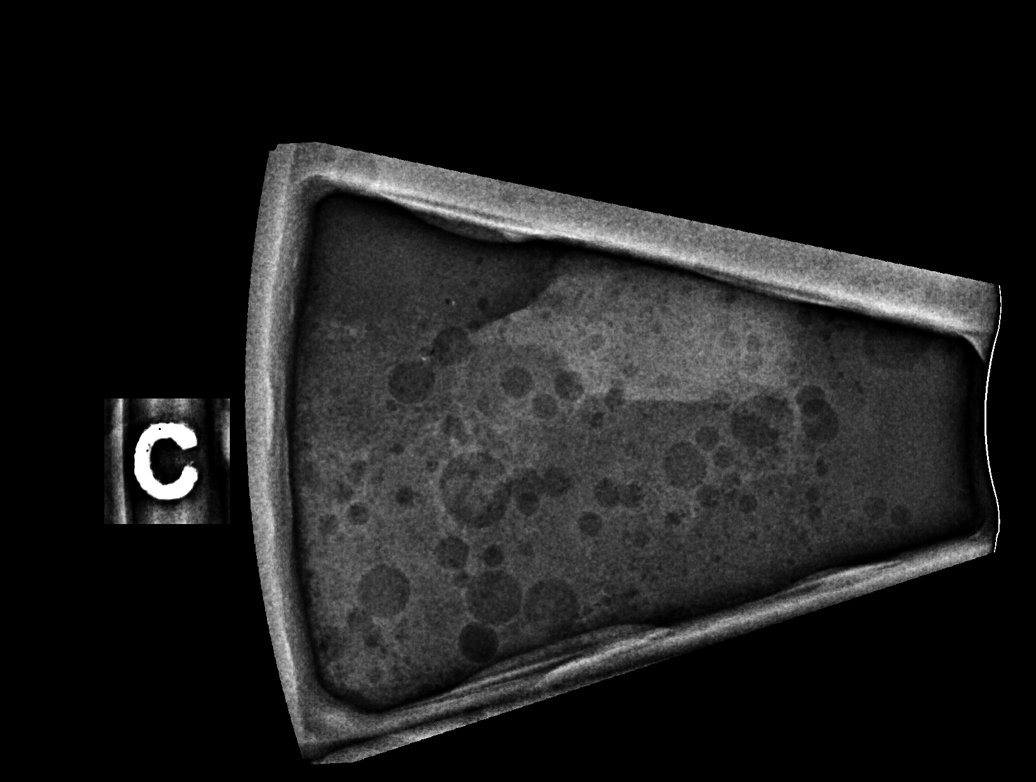

[R (6 of 6)]
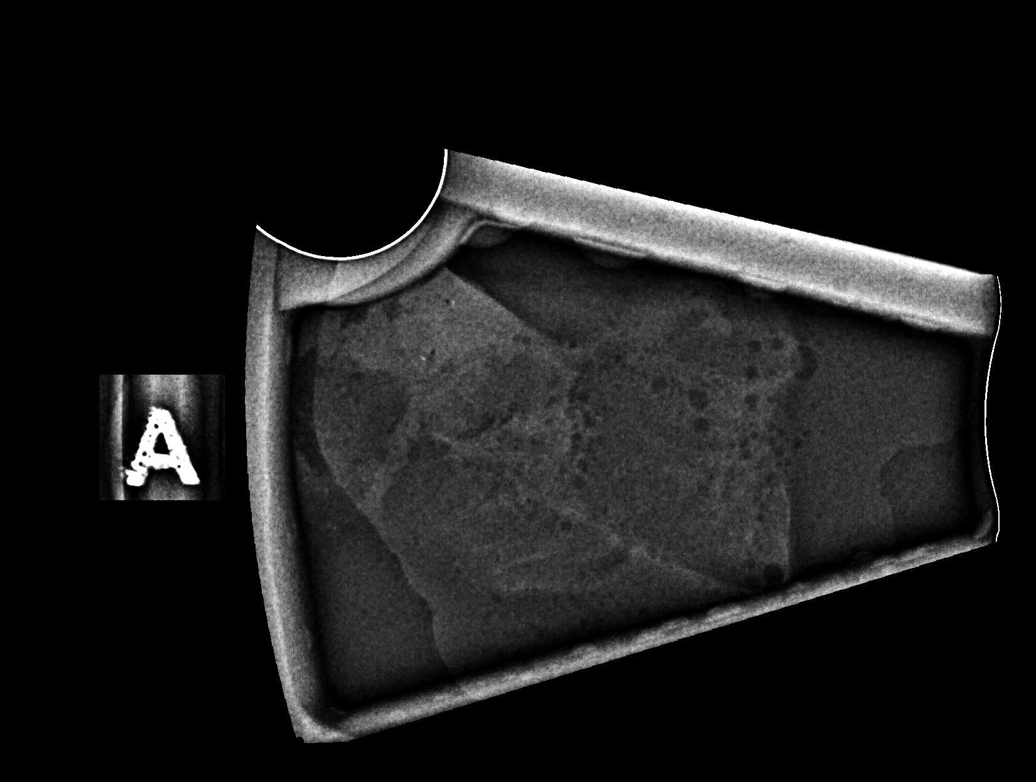

[6 of 26 positions shown; findings below may reference images not displayed]



Using sterile technique and 1% lidocaine and 1% lidocaine with
epinephrine as local anesthetic, under stereotactic guidance, a 9
gauge vacuum assisted device was used to perform core needle biopsy
of distortion in the LOWER OUTER QUADRANT of the RIGHT breast using
a LATERAL to MEDIAL approach.

Lesion quadrant: LOWER OUTER QUADRANT RIGHT breast

At the conclusion of the procedure, coil shaped tissue marker clip
was deployed into the biopsy cavity. Follow-up 2-view mammogram was
performed and dictated separately.
IMPRESSION: Stereotactic-guided biopsy of RIGHT breast distortion. No apparent
complications.

ADDENDUM:
PATHOLOGY ADDENDUM:

Pathology RIGHT breast LOWER OUTER QUADRANT:Breast, right, needle
core biopsy, lower outer quadrant, distortion, coil clip - COMPLEX
SCLEROSING LESION WITH MICROCALCIFICATIONS. Microscopic Comment:
Within the complex sclerosing lesion, there is epithelial
hyperplasia and apocrine metaplasia; an excisional biopsy is
recommended.

Pathology concordance with imaging findings: Yes. Excision
recommended.

Recommendation: Surgical consultation to discuss excision. Patient
is scheduled to see Dr. Tiger on 09/28/2020 for consultation.

At the request of the patient, I spoke with her by telephone on
09/24/2020 at [DATE]. She reports doing well after the biopsy .



Using sterile technique and 1% lidocaine and 1% lidocaine with
epinephrine as local anesthetic, under stereotactic guidance, a 9
gauge vacuum assisted device was used to perform core needle biopsy
of distortion in the LOWER OUTER QUADRANT of the RIGHT breast using
a LATERAL to MEDIAL approach.

Lesion quadrant: LOWER OUTER QUADRANT RIGHT breast

At the conclusion of the procedure, coil shaped tissue marker clip
was deployed into the biopsy cavity. Follow-up 2-view mammogram was
performed and dictated separately.
IMPRESSION: Stereotactic-guided biopsy of RIGHT breast distortion. No apparent
complications.

## 2023-01-15 ENCOUNTER — Ambulatory Visit: Payer: Managed Care, Other (non HMO) | Admitting: Family Medicine

## 2023-01-15 ENCOUNTER — Encounter: Payer: Self-pay | Admitting: Family Medicine

## 2023-01-15 VITALS — BP 120/72 | HR 60 | Temp 97.6°F | Ht 68.0 in | Wt 166.6 lb

## 2023-01-15 DIAGNOSIS — L729 Follicular cyst of the skin and subcutaneous tissue, unspecified: Secondary | ICD-10-CM

## 2023-01-15 DIAGNOSIS — H9201 Otalgia, right ear: Secondary | ICD-10-CM | POA: Diagnosis not present

## 2023-01-15 DIAGNOSIS — L089 Local infection of the skin and subcutaneous tissue, unspecified: Secondary | ICD-10-CM | POA: Diagnosis not present

## 2023-01-15 MED ORDER — SULFAMETHOXAZOLE-TRIMETHOPRIM 800-160 MG PO TABS: 1.0000 | ORAL_TABLET | Freq: Two times a day (BID) | ORAL | 0 refills | Status: AC

## 2023-01-15 NOTE — Patient Instructions (Signed)
Your exam is fairly normal.  I do appreciate some fullness where you are tender, and there is an enlarged lymph node, so we will go ahead and treat for a possibly early abscess/infection.  Use warm compresses, and ibuprofen (or other NSAID) for the pain. Take the antibiotic twice daily for 7-10 days (if you are 100% better within just a few days, 7 days might be enough). If you develop fever, redness, increased swelling, or other new concerns, return for re-evaluation.

## 2023-01-15 NOTE — Progress Notes (Signed)
Chief Complaint  Patient presents with   Ear Pain    Right eat pain, thinks there is a sebaceous cyst behind her ear (husband told her this). Been in pain since Sat. Not able to sleep if lays on that side. Took some advil but didn't help very much.    Saturday, while taking her shirt off, it caught her right ear and she noticed it was painful. The pain has gotten worse. She now can't sleep on that side due to pain. She tried warm compresses, didn't help much. No noted drainage. Her husband mentioned that it looked red. Started with a headache today, some soreness into the neck. Hearing feels a little muffled  PMH, PSH, SH reviewed  Outpatient Encounter Medications as of 01/15/2023  Medication Sig Note   cholecalciferol (VITAMIN D) 1000 UNITS tablet Take 2,000 Units by mouth daily.     Multiple Vitamins-Minerals (WOMENS MULTIVITAMIN PO) Take 1 tablet by mouth daily.    sulfamethoxazole-trimethoprim (BACTRIM DS) 800-160 MG tablet Take 1 tablet by mouth 2 (two) times daily.    albuterol (VENTOLIN HFA) 108 (90 Base) MCG/ACT inhaler INHALE 2 PUFFS INTO THE LUNGS EVERY 4 HOURS AS NEEDED FOR WHEEZING OR SHORTNESS OF BREATH. (Patient not taking: Reported on 01/15/2023) 01/15/2023: prn   ibuprofen (ADVIL) 200 MG tablet Take 400 mg by mouth every 6 (six) hours as needed. (Patient not taking: Reported on 01/15/2023) 01/15/2023: Last dose was  Sunday evening   NON FORMULARY Take 1 capsule by mouth daily. (Patient not taking: Reported on 07/17/2022) 01/15/2023: IB Gard prn, no in a couple of months   No facility-administered encounter medications on file as of 01/15/2023.   NOT taking septra prior to today's visit  Allergies  Allergen Reactions   Carbohydrate     intolerance   Tetracyclines & Related Other (See Comments)    Joint pain   ROS: No n/v/d, No fever, chills. No URI symptoms. Slight headache, R neck pain and muffled hearing.  Stomach has been doing well. No other concerns.   PHYSICAL  EXAM:  BP 120/72   Pulse 60   Temp 97.6 F (36.4 C) (Tympanic)   Ht  (1.727 m)   Wt 166 lb 9.6 oz (75.6 kg)   LMP 09/06/2011   BMI 25.33 kg/m   Wt Readings from Last 3 Encounters:  01/15/23 166 lb 9.6 oz (75.6 kg)  09/17/22 157 lb 12.8 oz (71.6 kg)  07/17/22 164 lb 3.2 oz (74.5 kg)    Pleasant, well-appearing female in good spirits HEENT: conjunctiva and sclera are clear, EOMI. Normal TM's and EAcs bilaterally Right ear--the area of discomfort is at the top portion where the external ear joins the scalp. No visible erythema, no visible soft tissue swelling.  Slight fullness noted superiorly compared to other part of this crease, and this area is very tender. No visible pore or any abnormality. Neck: no spinal tenderness. No anterior cervical adenopathy or thyromegaly. There is one small posterior LN at R neck, tender Neuro: alert and oriented, cranial nerves intact, normal gait Psych: normal mood, affect, hygiene and grooming.   ASSESSMENT/PLAN:  Right ear pain  Infected cyst of skin - Plan: sulfamethoxazole-trimethoprim (BACTRIM DS) 800-160 MG tablet  Exam is pretty benign, but is severely tender with some fullness, and lymphadenopathy, so treatment with warm compresses and ABX recommended. NSAIDs prn Septra DS x 7-10d   F/u if worsening

## 2023-07-07 ENCOUNTER — Telehealth: Payer: Self-pay

## 2023-07-07 NOTE — Telephone Encounter (Signed)
Dr. Tobi Bastos, patient was seen at [facility] but they just saw her one time. Patient wants to know if she could get a refill on Rifaxan as it was given prior for her eructation and abdominal pain. Please advise.

## 2023-07-07 NOTE — Telephone Encounter (Signed)
I do not see that you have rx'd this for her in the past. I see where it was mentioned at her last CPE. Do you need to see her? And if so, do you need 30 min?

## 2023-07-07 NOTE — Telephone Encounter (Signed)
She has gotten this through her GI.  I believe she has already done 2 courses of this in the past.  She should go through her GI for this (I rarely prescribe, unsure of the frequency that is ok, or duration).

## 2023-07-07 NOTE — Telephone Encounter (Signed)
Patient called in and left a voicemail to get medication refill. I called the patient back and I let her know that since she is a patient of [facility] she will have to contact her provider at [facility]. She said they was to suppose to send a message to Dr. Tobi Bastos to transfer her care back here.

## 2023-07-07 NOTE — Telephone Encounter (Signed)
Pt.notified

## 2023-07-07 NOTE — Telephone Encounter (Signed)
Pt is experiencing a flare up. Experiencing continuous burping despite modified diet. Requesting refill on xifaxan.

## 2023-07-08 ENCOUNTER — Other Ambulatory Visit: Payer: Self-pay

## 2023-07-08 MED ORDER — RIFAXIMIN 550 MG PO TABS: 550.0000 mg | ORAL_TABLET | Freq: Three times a day (TID) | ORAL | 0 refills | Status: AC

## 2023-07-08 NOTE — Telephone Encounter (Signed)
Called patient back to let her know that Dr. Tobi Bastos was okay for me to send her a prescription for Xifaxan to her local pharmacy. Patient agreed and had no further questions.

## 2023-07-27 ENCOUNTER — Telehealth: Payer: Self-pay | Admitting: Family Medicine

## 2023-07-27 NOTE — Telephone Encounter (Signed)
Pt is traveling and needs a copy of her immunizations but she said when she went online to print them herself, it is missing some vaccines she knows she has gotten like tdap, flu, mmr, and varicella. Can you help with this?

## 2023-07-27 NOTE — Telephone Encounter (Signed)
Spoke with patient and handled this.

## 2023-09-21 ENCOUNTER — Encounter: Payer: 59 | Admitting: Family Medicine

## 2023-09-24 ENCOUNTER — Other Ambulatory Visit: Payer: Self-pay | Admitting: Family Medicine

## 2023-09-24 DIAGNOSIS — Z1231 Encounter for screening mammogram for malignant neoplasm of breast: Secondary | ICD-10-CM

## 2023-10-29 ENCOUNTER — Ambulatory Visit
Admission: RE | Admit: 2023-10-29 | Discharge: 2023-10-29 | Disposition: A | Discharge status: Home / Self Care | Payer: Managed Care, Other (non HMO) | Source: Ambulatory Visit | Attending: Family Medicine | Admitting: Family Medicine

## 2023-10-29 DIAGNOSIS — Z1231 Encounter for screening mammogram for malignant neoplasm of breast: Secondary | ICD-10-CM

## 2024-09-30 ENCOUNTER — Other Ambulatory Visit: Payer: Self-pay | Admitting: Family Medicine

## 2024-09-30 DIAGNOSIS — Z1231 Encounter for screening mammogram for malignant neoplasm of breast: Secondary | ICD-10-CM

## 2024-11-01 ENCOUNTER — Ambulatory Visit
